# Patient Record
Sex: Male | Born: 1965 | Race: White | Hispanic: No | Marital: Married | Smoking: Former smoker
Health system: Southern US, Community
[De-identification: ages and names within clinical notes are randomized; demographics above are authoritative.]

## PROBLEM LIST (undated history)

## (undated) DIAGNOSIS — E785 Hyperlipidemia, unspecified: Secondary | ICD-10-CM

## (undated) DIAGNOSIS — I639 Cerebral infarction, unspecified: Secondary | ICD-10-CM

## (undated) DIAGNOSIS — I10 Essential (primary) hypertension: Secondary | ICD-10-CM

---

## 2001-03-13 ENCOUNTER — Ambulatory Visit (HOSPITAL_COMMUNITY): Admission: RE | Admit: 2001-03-13 | Discharge: 2001-03-13 | Payer: Self-pay | Admitting: Specialist

## 2001-03-13 ENCOUNTER — Encounter: Payer: Self-pay | Admitting: Specialist

## 2001-07-13 ENCOUNTER — Encounter: Payer: Self-pay | Admitting: Specialist

## 2001-07-13 ENCOUNTER — Encounter: Admission: RE | Admit: 2001-07-13 | Discharge: 2001-07-13 | Payer: Self-pay | Admitting: Specialist

## 2001-11-14 ENCOUNTER — Ambulatory Visit (HOSPITAL_COMMUNITY): Admission: RE | Admit: 2001-11-14 | Discharge: 2001-11-15 | Payer: Self-pay | Admitting: Neurosurgery

## 2001-11-14 ENCOUNTER — Encounter: Payer: Self-pay | Admitting: Neurosurgery

## 2003-02-23 HISTORY — PX: BACK SURGERY: SHX140

## 2006-09-05 ENCOUNTER — Ambulatory Visit (HOSPITAL_BASED_OUTPATIENT_CLINIC_OR_DEPARTMENT_OTHER): Admission: RE | Admit: 2006-09-05 | Discharge: 2006-09-05 | Payer: Self-pay | Admitting: Family Medicine

## 2006-09-11 ENCOUNTER — Ambulatory Visit: Payer: Self-pay | Admitting: Internal Medicine

## 2010-07-07 NOTE — Procedures (Signed)
NAME:  Larry Padilla, Larry Padilla            ACCOUNT NO.:  [id number]   MEDICAL RECORD NO.:  [id number]          PATIENT TYPE:  OUT   LOCATION:  SLEEP CENTER                 FACILITY:  [facility]   PHYSICIAN:  Clinton D. Maple Hudson, MD, FCCP, FACPDATE OF BIRTH:  1966/12/07   DATE OF STUDY:  09/05/2006                            NOCTURNAL POLYSOMNOGRAM   REFERRING PHYSICIAN:  Marge Duncans, NP   INDICATION FOR STUDY:  Hypersomnia with sleep apnea.   EPWORTH SLEEPINESS SCORE:  16/24, height 63 inches, weight 350 pounds.   HOME MEDICATIONS:  Listed and reviewed.   SLEEP ARCHITECTURE:  Short total sleep time 251 minutes with sleep  efficiency 70%.  Stage I was 7%; stage II 68%; stage III 17%; REM 8% of  total sleep time.  Sleep latency 11 minutes.  REM latency 177 minutes.  Awake after sleep 50 minutes.  Arousal index 10.5.  No bedtime  medication was taken.  Sleep onset was at 9:52 p.m.  The recording ended  after spontaneous waking around 3 a.m.  He said sleep quality was the  same as usual, interrupted and that his back hurt.   RESPIRATORY DATA:  Apnea/hypopnea index (AHI, RDI) 1 obstructive event  per hour which is within normal.  There were a total of 4 hypopneas  primarily associated with sleep in the left side down position.  REM AHI  zero.  There were insufficient events to permit CPAP titration by split  protocol on the study night.   OXYGEN DATA:  Very loud snoring with oxygen desaturation to a nadir of  86%.  Mean oxygen saturation through the study was 92% on room air.   CARDIAC DATA:  Normal sinus rhythm.   MOVEMENT-PARASOMNIA:  No significant movement disturbance, no bathroom  trips.   IMPRESSIONS-RECOMMENDATIONS:  1. Short total sleep time and pattern of repeated nonspecific wakings      during sleep before early waking in the morning around 3 a.m.      suggest comparison with the patient's home sleep quality and      experience.  2. Review of the recorded data does not  suggest significant      respiratory events of leg movements as a basis for sleep      fragmentation.  AHI showing 1 obstructive event per hour indicates      there is not stated a sleep apnea pattern.  This is a little      surprising given the patient's body habitus and loud snoring but      may be correct.      Suggest comparison with witnessed respiratory pattern.  It may      become appropriate to re-study this patient at some time in the      future if symptoms persist.      Clinton D. Maple Hudson, MD, [facility], FACP  Diplomate, Biomedical engineer of Sleep Medicine  Electronically Signed     CDY/MEDQ  D:  09/11/2006 13:13:49  T:  09/11/2006 15:24:47  Job:  [id number]

## 2010-07-10 NOTE — Op Note (Signed)
NAME:  Larry Padilla, HACKER                        ACCOUNT NO.:  [id number]   MEDICAL RECORD NO.:  [id number]                   PATIENT TYPE:  OIB   LOCATION:  2899                                 FACILITY:  [facility]   PHYSICIAN:  Reinaldo Meeker, M.D.              DATE OF BIRTH:  1965-12-24   DATE OF PROCEDURE:  11/14/2001  DATE OF DISCHARGE:                                 OPERATIVE REPORT   PREOPERATIVE DIAGNOSIS:  Herniated disk at L4-L5, central and right.   POSTOPERATIVE DIAGNOSIS:  Herniated disk at L4-L5, central and right.   PROCEDURES:  1. Right L4-L5 intralaminar laminotomy for excision of herniated disk with     operating microscope.  2. Microdissection, L4-L5 disk and L5 nerve root.   SURGEON:  Reinaldo Meeker, M.D.   ASSISTANT:  Donzetta Sprung. Wynetta Emery, M.D.   PROCEDURE IN DETAIL:  After being placed in the prone position, the  patient's back was prepped and draped in the usual sterile fashion.  Localization x-ray was taken prior to incision to identify the appropriate  level.  A midline incision was made above the spinous processes of L4 and  L5.  Using Bovie cutting current, the incision was carried down the spinous  processes.  Subperiosteal dissection was then carried out along the right  side of the spinous processes and lamina and a McCall self-retaining  retractor was placed for exposure.  A second x-ray showed approach of the L4-  L5 level.  Using the high-speed drill, the inferior one-half of the L4  lamina and the medial one-third of the facet removed.  The drill was then  used for the superior one-third of the L5 lamina.  Residual bone and  ligamentum flavum removed in a piecemeal fashion.  The microscope was draped  and brought into the field and used for the remainder the case.   Using microdissection technique, the lateral aspect of the thecal sac and  nerve root were identified.  Photocoagulation was carried out down the floor  of the canal to identify the L4-L5  disk which was found to be herniated  centrally.  After coagulating on the annulus, the annulus was incised with a  15-blade.  Using pituitary rongeurs ad curets, very thorough disk space  cleanout was carried out.  At the same time great care was taken to avoid  injury to the neural elements and this was successfully done.  At this point  inspection was carried out in all directions without any evidence of  residual compression and none could be identified.  Large amounts of  irrigation were carried out and any bleeding was controlled with bipolar  coagulation and Gelfoam.  The wound was then closed using intraoperative  Vicryl on the muscle, fascia, subcutaneous and subcuticular tissues and  staples on the skin.  A sterile dressing was then applied and the patient  was extubated and taken to the recovery room in  stable condition.                                                 Reinaldo Meeker, M.D.    ROK/MEDQ  D:  11/14/2001  T:  11/15/2001  Job:  [id number]

## 2011-03-26 DIAGNOSIS — I639 Cerebral infarction, unspecified: Secondary | ICD-10-CM

## 2011-03-26 HISTORY — DX: Cerebral infarction, unspecified: I63.9

## 2011-05-12 ENCOUNTER — Inpatient Hospital Stay (HOSPITAL_COMMUNITY)
Admission: EM | Admit: 2011-05-12 | Discharge: 2011-05-17 | DRG: 065 | Disposition: A | Discharge status: Home / Self Care | Payer: Self-pay | Source: Ambulatory Visit | Attending: Neurology | Admitting: Neurology

## 2011-05-12 ENCOUNTER — Encounter (HOSPITAL_COMMUNITY): Payer: Self-pay | Admitting: *Deleted

## 2011-05-12 ENCOUNTER — Emergency Department (HOSPITAL_COMMUNITY): Payer: Self-pay

## 2011-05-12 DIAGNOSIS — F419 Anxiety disorder, unspecified: Secondary | ICD-10-CM | POA: Diagnosis present

## 2011-05-12 DIAGNOSIS — H547 Unspecified visual loss: Secondary | ICD-10-CM | POA: Diagnosis present

## 2011-05-12 DIAGNOSIS — I619 Nontraumatic intracerebral hemorrhage, unspecified: Principal | ICD-10-CM | POA: Diagnosis present

## 2011-05-12 DIAGNOSIS — R51 Headache: Secondary | ICD-10-CM | POA: Diagnosis present

## 2011-05-12 DIAGNOSIS — T40605A Adverse effect of unspecified narcotics, initial encounter: Secondary | ICD-10-CM | POA: Diagnosis not present

## 2011-05-12 DIAGNOSIS — H53469 Homonymous bilateral field defects, unspecified side: Secondary | ICD-10-CM | POA: Diagnosis present

## 2011-05-12 DIAGNOSIS — E876 Hypokalemia: Secondary | ICD-10-CM | POA: Diagnosis not present

## 2011-05-12 DIAGNOSIS — IMO0001 Reserved for inherently not codable concepts without codable children: Secondary | ICD-10-CM | POA: Diagnosis present

## 2011-05-12 DIAGNOSIS — E785 Hyperlipidemia, unspecified: Secondary | ICD-10-CM | POA: Diagnosis present

## 2011-05-12 DIAGNOSIS — R110 Nausea: Secondary | ICD-10-CM | POA: Diagnosis not present

## 2011-05-12 DIAGNOSIS — I10 Essential (primary) hypertension: Secondary | ICD-10-CM | POA: Diagnosis present

## 2011-05-12 DIAGNOSIS — F411 Generalized anxiety disorder: Secondary | ICD-10-CM | POA: Diagnosis not present

## 2011-05-12 DIAGNOSIS — R519 Headache, unspecified: Secondary | ICD-10-CM | POA: Diagnosis present

## 2011-05-12 DIAGNOSIS — E669 Obesity, unspecified: Secondary | ICD-10-CM | POA: Diagnosis present

## 2011-05-12 DIAGNOSIS — I629 Nontraumatic intracranial hemorrhage, unspecified: Secondary | ICD-10-CM

## 2011-05-12 DIAGNOSIS — Z6839 Body mass index (BMI) 39.0-39.9, adult: Secondary | ICD-10-CM

## 2011-05-12 HISTORY — DX: Essential (primary) hypertension: I10

## 2011-05-12 LAB — POCT I-STAT, CHEM 8
Calcium, Ion: 1.18 mmol/L (ref 1.12–1.32)
HCT: 50 % (ref 39.0–52.0)
Hemoglobin: 17 g/dL (ref 13.0–17.0)
TCO2: 23 mmol/L (ref 0–100)

## 2011-05-12 LAB — CBC
Platelets: 308 10*3/uL (ref 150–400)
RBC: 5.13 MIL/uL (ref 4.22–5.81)
RDW: 13.3 % (ref 11.5–15.5)
WBC: 14.3 10*3/uL — ABNORMAL HIGH (ref 4.0–10.5)

## 2011-05-12 LAB — SEDIMENTATION RATE: Sed Rate: 12 mm/hr (ref 0–16)

## 2011-05-12 LAB — PROTIME-INR
INR: 0.97 (ref 0.00–1.49)
Prothrombin Time: 13.1 seconds (ref 11.6–15.2)

## 2011-05-12 MED ORDER — NICARDIPINE HCL IN NACL 20-0.86 MG/200ML-% IV SOLN
3.0000 mg/h | INTRAVENOUS | Status: DC
  Administered 2011-05-12: 7.5 mg/h via INTRAVENOUS
  Administered 2011-05-12 – 2011-05-13 (×2): 5 mg/h via INTRAVENOUS
  Administered 2011-05-13: 7.5 mg/h via INTRAVENOUS
  Administered 2011-05-13 (×2): 5 mg/h via INTRAVENOUS
  Filled 2011-05-12 (×11): qty 200

## 2011-05-12 MED ORDER — PANTOPRAZOLE SODIUM 40 MG IV SOLR
40.0000 mg | Freq: Every day | INTRAVENOUS | Status: DC
  Filled 2011-05-12: qty 40

## 2011-05-12 MED ORDER — MORPHINE SULFATE 2 MG/ML IJ SOLN
1.0000 mg | INTRAMUSCULAR | Status: DC | PRN
  Administered 2011-05-12 (×3): 2 mg via INTRAVENOUS
  Administered 2011-05-12 – 2011-05-13 (×3): 4 mg via INTRAVENOUS
  Administered 2011-05-13: 2 mg via INTRAVENOUS
  Administered 2011-05-13: 4 mg via INTRAVENOUS
  Administered 2011-05-13 (×2): 2 mg via INTRAVENOUS
  Administered 2011-05-13: 4 mg via INTRAVENOUS
  Filled 2011-05-12: qty 2
  Filled 2011-05-12 (×2): qty 1
  Filled 2011-05-12 (×3): qty 2
  Filled 2011-05-12: qty 1
  Filled 2011-05-12: qty 2
  Filled 2011-05-12: qty 1
  Filled 2011-05-12: qty 2
  Filled 2011-05-12: qty 1

## 2011-05-12 MED ORDER — SENNOSIDES-DOCUSATE SODIUM 8.6-50 MG PO TABS
1.0000 | ORAL_TABLET | Freq: Two times a day (BID) | ORAL | Status: DC
  Administered 2011-05-12 – 2011-05-17 (×8): 1 via ORAL
  Filled 2011-05-12 (×8): qty 1

## 2011-05-12 MED ORDER — PANTOPRAZOLE SODIUM 40 MG PO TBEC
40.0000 mg | DELAYED_RELEASE_TABLET | Freq: Every day | ORAL | Status: DC
  Administered 2011-05-13 – 2011-05-16 (×4): 40 mg via ORAL
  Filled 2011-05-12 (×5): qty 1

## 2011-05-12 MED ORDER — METOCLOPRAMIDE HCL 5 MG/ML IJ SOLN
10.0000 mg | Freq: Once | INTRAMUSCULAR | Status: AC
  Administered 2011-05-12: 10 mg via INTRAVENOUS
  Filled 2011-05-12: qty 2

## 2011-05-12 MED ORDER — DIPHENHYDRAMINE HCL 50 MG/ML IJ SOLN
25.0000 mg | Freq: Once | INTRAMUSCULAR | Status: AC
  Administered 2011-05-12: 25 mg via INTRAVENOUS
  Filled 2011-05-12: qty 1

## 2011-05-12 MED ORDER — LABETALOL HCL 5 MG/ML IV SOLN
20.0000 mg | Freq: Once | INTRAVENOUS | Status: AC
  Administered 2011-05-12: 20 mg via INTRAVENOUS
  Filled 2011-05-12: qty 4

## 2011-05-12 MED ORDER — TRAMADOL HCL 50 MG PO TABS
50.0000 mg | ORAL_TABLET | Freq: Four times a day (QID) | ORAL | Status: DC | PRN
  Administered 2011-05-12: 100 mg via ORAL
  Filled 2011-05-12: qty 2

## 2011-05-12 MED ORDER — ACETAMINOPHEN 650 MG RE SUPP: 650.0000 mg | RECTAL | Status: DC | PRN

## 2011-05-12 MED ORDER — ACETAMINOPHEN 325 MG PO TABS
650.0000 mg | ORAL_TABLET | ORAL | Status: DC | PRN
  Administered 2011-05-17: 650 mg via ORAL
  Filled 2011-05-12: qty 2

## 2011-05-12 MED ORDER — NICARDIPINE HCL IN NACL 20-0.86 MG/200ML-% IV SOLN: 3.0000 mg/h | INTRAVENOUS | Status: DC

## 2011-05-12 MED ORDER — SODIUM CHLORIDE 0.9 % IV SOLN
Freq: Once | INTRAVENOUS | Status: AC
  Administered 2011-05-12: 15:00:00 via INTRAVENOUS

## 2011-05-12 MED ORDER — DEXAMETHASONE SODIUM PHOSPHATE 10 MG/ML IJ SOLN
10.0000 mg | Freq: Once | INTRAMUSCULAR | Status: AC
  Administered 2011-05-12: 10 mg via INTRAVENOUS
  Filled 2011-05-12: qty 1

## 2011-05-12 MED ORDER — ONDANSETRON HCL 4 MG/2ML IJ SOLN
4.0000 mg | Freq: Four times a day (QID) | INTRAMUSCULAR | Status: DC | PRN
  Administered 2011-05-13 – 2011-05-16 (×3): 4 mg via INTRAVENOUS
  Filled 2011-05-12 (×3): qty 2

## 2011-05-12 NOTE — H&P (Signed)
Admission H&P    Chief Complaint: Headache, difficulty with vision HPI: Larry Padilla is an 46 y.o. male who reports that for the past 2 months he has been having trouble with blurry vision and markedly elevated blood pressure.  Went to bed normal last evening but awakened this morning unable to see to the right and with a severe headache behind the right eye.  He describes the headache as sharp.  There is no associated nausea and vomiting but there is photophobia and phonophobia.  Patient attempted to take his BP meds and pain meds at home but after no relief presented for further evaluation.  Head CT shows a left occipital intracerebral hemorrhage.    LSN: 05/11/2011 tPA Given: No: ICH MRankin: 0  Past Medical History  Diagnosis Date  . Hypertension     History reviewed. No pertinent past surgical history.  History reviewed. No pertinent family history.  Social History:  reports that he has never smoked. He has never used smokeless tobacco. He reports that he does not drink alcohol or use illicit drugs.  Allergies: No Known Allergies  Medications Prior to Admission  Medication Dose Route Frequency Provider Last Rate Last Dose  . 0.9 %  sodium chloride infusion   Intravenous Once Carleene Cooper III, MD 160 mL/hr at 05/12/11 1526    . dexamethasone (DECADRON) injection 10 mg  10 mg Intravenous Once Carleene Cooper III, MD   10 mg at 05/12/11 1528  . diphenhydrAMINE (BENADRYL) injection 25 mg  25 mg Intravenous Once Carleene Cooper III, MD   25 mg at 05/12/11 1527  . labetalol (NORMODYNE,TRANDATE) injection 20 mg  20 mg Intravenous Once Carleene Cooper III, MD   20 mg at 05/12/11 1531  . labetalol (NORMODYNE,TRANDATE) injection 20 mg  20 mg Intravenous Once Carleene Cooper III, MD   20 mg at 05/12/11 1643  . metoCLOPramide (REGLAN) injection 10 mg  10 mg Intravenous Once Carleene Cooper III, MD   10 mg at 05/12/11 1531   No current outpatient prescriptions on file as of 05/12/2011.  Home  Medications: Lisinopril, Tylenol  ROS: History obtained from the patient  General ROS: negative for - chills, fatigue, fever, night sweats, weight gain or weight loss Psychological ROS: negative for - behavioral disorder, hallucinations, memory difficulties, mood swings or suicidal ideation Ophthalmic ROS: as noted in HPI ENT ROS: negative for - epistaxis, nasal discharge, oral lesions, sore throat, tinnitus or vertigo Allergy and Immunology ROS: negative for - hives or itchy/watery eyes Hematological and Lymphatic ROS: negative for - bleeding problems, bruising or swollen lymph nodes Endocrine ROS: negative for - galactorrhea, hair pattern changes, polydipsia/polyuria or temperature intolerance Respiratory ROS: negative for - cough, hemoptysis, shortness of breath or wheezing Cardiovascular ROS: negative for - chest pain, dyspnea on exertion, edema or irregular heartbeat Gastrointestinal ROS: negative for - abdominal pain, diarrhea, hematemesis, nausea/vomiting or stool incontinence Genito-Urinary ROS: negative for - dysuria, hematuria, incontinence or urinary frequency/urgency Musculoskeletal ROS: negative for - joint swelling or muscular weakness Neurological ROS: as noted in HPI Dermatological ROS: negative for rash and skin lesion changes  Physical Examination: Blood pressure 191/104, pulse 73, temperature 97.5 F (36.4 C), resp. rate 18, SpO2 98.00%.  HEENT-  Normocephalic, no lesions, without obvious abnormality.  Normal external eye and conjunctiva.  Normal TM's bilaterally.  Normal auditory canals and external ears. Normal external nose, mucus membranes and septum.  Normal pharynx. Neck supple with no masses, nodes, nodules or enlargement. Cardiovascular - S1, S2 normal Lungs -  chest clear, no wheezing, rales, normal symmetric air entry Abdomen - soft, non-tender; bowel sounds normal; no masses,  no organomegaly Extremities - no edema  Neurologic Examination: Mental  Status: Alert, oriented, thought content appropriate.  Speech fluent without evidence of aphasia.  Able to follow 3 step commands without difficulty. Cranial Nerves: II: RHH, pupils equal, round, reactive to light and accommodation III,IV, VI: ptosis not present, extra-ocular motions intact bilaterally V,VII: smile symmetric, facial light touch sensation normal bilaterally VIII: hearing normal bilaterally IX,X: gag reflex present XI: trapezius strength/neck flexion strength normal bilaterally XII: tongue strength normal  Motor: Right : Upper extremity   5/5    Left:     Upper extremity   5-/5 (give-way weakness with no evidence of a drift)  Lower extremity   5/5     Lower extremity   5/5 Tone and bulk:normal tone throughout; no atrophy noted Sensory: Pinprick and light touch intact throughout, bilaterally Deep Tendon Reflexes: 1+ and symmetric with absent AJ's bilaterally Plantars: Right: upgoing   Left: downgoing Cerebellar: normal finger-to-nose and normal heel-to-shin test Presenting with headache and   Results for orders placed during the hospital encounter of 05/12/11 (from the past 48 hour(s))  CBC     Status: Abnormal   Collection Time   05/12/11  3:20 PM      Component Value Range Comment   WBC 14.3 (*) 4.0 - 10.5 (K/uL)    RBC 5.13  4.22 - 5.81 (MIL/uL)    Hemoglobin 15.8  13.0 - 17.0 (g/dL)    HCT 16.1  09.6 - 04.5 (%)    MCV 86.5  78.0 - 100.0 (fL)    MCH 30.8  26.0 - 34.0 (pg)    MCHC 35.6  30.0 - 36.0 (g/dL)    RDW 40.9  81.1 - 91.4 (%)    Platelets 308  150 - 400 (K/uL)   PROTIME-INR     Status: Normal   Collection Time   05/12/11  3:20 PM      Component Value Range Comment   Prothrombin Time 13.1  11.6 - 15.2 (seconds)    INR 0.97  0.00 - 1.49    POCT I-STAT, CHEM 8     Status: Abnormal   Collection Time   05/12/11  4:24 PM      Component Value Range Comment   Sodium 139  135 - 145 (mEq/L)    Potassium 3.7  3.5 - 5.1 (mEq/L)    Chloride 106  96 - 112  (mEq/L)    BUN 15  6 - 23 (mg/dL)    Creatinine, Ser 7.82  0.50 - 1.35 (mg/dL)    Glucose, Bld 956 (*) 70 - 99 (mg/dL)    Calcium, Ion 2.13  1.12 - 1.32 (mmol/L)    TCO2 23  0 - 100 (mmol/L)    Hemoglobin 17.0  13.0 - 17.0 (g/dL)    HCT 08.6  57.8 - 46.9 (%)    Ct Head Wo Contrast  05/12/2011  *RADIOLOGY REPORT*  Clinical Data: Severe frontal headache.  Hypertension.  CT HEAD WITHOUT CONTRAST  Technique:  Contiguous axial images were obtained from the base of the skull through the vertex without contrast.  Comparison: None.  Findings: There is an acute intraparenchymal hemorrhage in the left occipital lobe measuring 4.1 x 2.2 by 2 cm.  There is mild surrounding edema.  No shift.  Elsewhere, I think there are a few areas of low density in the hemispheric white matter suggesting chronic  small vessel disease.  No subarachnoid blood.  No intraventricular blood.  No sign of mass, hydrocephalus or extra- axial collection.  The calvarium is unremarkable.  Sinuses are clear.  IMPRESSION: Acute intraparenchymal hematoma left occipital lobe with volume measuring 8 ml.  Very mild surrounding edema.  Critical Value/emergent results were called by telephone at the time of interpretation on 05/12/2011  at 1555 hours  to  Dr. Patrica Duel, who verbally acknowledged these results.  Original Report Authenticated By: Thomasenia Sales, M.D.    Assessment: 46 y.o. male presenting with headache and RHH.  Left occipital hemrorrhage noted on imaging.  ICH likely related to hypertension.  BP markedly elevated and not responding sufficiently to Labetolol.  Pain control may help BP a well.    Stroke Risk Factors - hypertension  Plan: 1. HgbA1c, fasting lipid panel 2. MRI, MRA  of the brain without contrast 3. PT consult 4. Echocardiogram 5. Carotid dopplers 6. Prophylactic therapy-to be held at this time. 7. Cardene drip for BP control 8. Telemetry monitoring 9.  Admission to 3100 for monitoring.  Neurosurgery has evaluated  the patient and has determined that he is not an operative candidate.   10. Pain control    Thana Farr, MD [facility] [phone] 05/12/2011, 4:56 PM

## 2011-05-12 NOTE — ED Notes (Signed)
Pt resting, responsive to voice. Moves all extremities. Pupils sluggish 2+, weaker on left side. EDP aware of pt BP

## 2011-05-12 NOTE — ED Notes (Signed)
Pt reporting decrease in peripheral vision. BP 190/98. Pt appearing to be having anxiety attack. Dr. Ignacia Palma notified. Pt provided bag to take deep breaths.

## 2011-05-12 NOTE — ED Notes (Signed)
Per EMS- pt has had headache that started this morning at approx 7am. Pt states it is in the front of his head. Pt has hx of high BP. Take lisinopril at night. Pt took tylenol this morning with no relief. Bp 183/139. 70 bpm. Pt was on 4L.

## 2011-05-12 NOTE — ED Notes (Signed)
Pt reporting woke up this morning with headache, headache has worsened throughout the day. Pt reporting decreased peripheral vision. Pt reporting nausea, denying any vomiting. Vision diminished in periphery. Pupils sluggish. Pt responsive.

## 2011-05-12 NOTE — ED Notes (Signed)
3103-01 Ready 

## 2011-05-12 NOTE — ED Provider Notes (Cosign Needed)
History     CSN: [id number]  Arrival date & time 05/12/11  1429   First MD Initiated Contact with Patient 05/12/11 1506      Chief Complaint  Patient presents with  . Headache    (Consider location/radiation/quality/duration/timing/severity/associated sxs/prior treatment) HPI Comments: Patient is a 46 year old man who comes in complaining of severe headache. This started this morning. He says it hurts so bad he can't see. He says he can't see his right eye and has total focusing the left eye. He took aspirin and some other type of medicine for headache without relief he also took his lisinopril pill and had been prescribed in ED in the past. None of this seemed to help. The pain is in the left side of his head. He has associated nausea. He had no prior similar headache history.  Patient is a 46 y.o. male presenting with headaches.  Headache  This is a new problem. The current episode started 6 to 12 hours ago. The problem occurs constantly. The problem has not changed since onset.The headache is associated with nothing. The pain is located in the left unilateral region. The quality of the pain is described as throbbing. The pain is at a severity of 10/10. The pain is severe. The pain does not radiate. Associated symptoms include anorexia and nausea. Pertinent negatives include no chest pressure, no syncope, no shortness of breath and no vomiting. Treatments tried: Patient took an aspirin and some other type of over-the-counter pain medicine without relief. He also took a lisinopril without relief.    Past Medical History  Diagnosis Date  . Hypertension     No past surgical history on file.  No family history on file.  History  Substance Use Topics  . Smoking status: Not on file  . Smokeless tobacco: Not on file  . Alcohol Use:       Review of Systems  Constitutional: Negative.   Eyes: Positive for visual disturbance.  Respiratory: Negative.  Negative for shortness of  breath.   Cardiovascular: Negative.  Negative for chest pain and syncope.  Gastrointestinal: Positive for nausea and anorexia. Negative for vomiting and abdominal pain.  Genitourinary: Negative.   Musculoskeletal: Negative.   Neurological: Positive for headaches.  Psychiatric/Behavioral: Negative.     Allergies  Review of patient's allergies indicates not on file.  Home Medications   Current Outpatient Rx  Name Route Sig Dispense Refill  . LISINOPRIL 20 MG PO TABS Oral Take 20 mg by mouth daily.      BP 190/98  Pulse 77  Temp 97.5 F (36.4 C)  Resp 22  SpO2 100%  Physical Exam  Nursing note and vitals reviewed. Constitutional: He is oriented to person, place, and time.       Obese man in acute distress complaining of headache felt in the left temporal region. He is hypertensive on vital signs.  HENT:  Head: Normocephalic and atraumatic.  Right Ear: External ear normal.  Left Ear: External ear normal.  Eyes: Conjunctivae and EOM are normal. Pupils are equal, round, and reactive to light.       Patient cannot count fingers accurately with either eye.  Neck: Normal range of motion. Neck supple.  Cardiovascular: Normal rate, regular rhythm and normal heart sounds.   Pulmonary/Chest: Effort normal and breath sounds normal.  Abdominal: Soft. Bowel sounds are normal.  Musculoskeletal: Normal range of motion. He exhibits no edema and no tenderness.  Neurological: He is alert and oriented to person, place, and  time.       Patient claims a loss of visual acuity. There is no other sensory deficit. There is no apparent motor deficit.  Skin: Skin is warm and dry.  Psychiatric: He has a normal mood and affect. His behavior is normal.    ED Course  CRITICAL CARE Performed by: Osvaldo Human Authorized by: Osvaldo Human Total critical care time: 30 minutes Critical care was necessary to treat or prevent imminent or life-threatening deterioration of the following  conditions: Pt with severe headache and cortical blindness, who had hypertensive bleed in left occipital region. Critical care was time spent personally by me on the following activities: development of treatment plan with patient or surrogate, discussions with consultants, evaluation of patient's response to treatment, examination of patient, obtaining history from patient or surrogate, ordering and performing treatments and interventions, ordering and review of laboratory studies, ordering and review of radiographic studies, re-evaluation of patient's condition and review of old charts.   (including critical care time) 3:22 PM Pt was seen and had physical examination.  IV fluids, oxygen ordered.  IV medications for headache with Reglan, dexamethasone, and benadryl, and for hypertension with labetalol.  Lab workup and head CT ordered.   3:57 PM CT of head shows left occipital bleed.  Call to Neurosurgery --> 4:16 PM Dr. Wynetta Emery says that this hemorrhage is in a non-operative area of the brain, advised call to Neurology.  4:30 PM Dr. Thad Ranger called back.  She will see pt.  Pt's BP remains high.  Repeated IV labetalol.   5:37 PM Pt seen by Dr. Thad Ranger, who is admitting pt to 3100, placing him on a Cardene infusion for BP control  1. Intracranial hemorrhage           Carleene Cooper III, MD 05/12/11 [phone]

## 2011-05-12 NOTE — ED Notes (Signed)
Swallow screen not done in ER due to pt being NPO and needing to lay flat-30 degrees.

## 2011-05-13 ENCOUNTER — Inpatient Hospital Stay (HOSPITAL_COMMUNITY): Payer: Self-pay

## 2011-05-13 DIAGNOSIS — I369 Nonrheumatic tricuspid valve disorder, unspecified: Secondary | ICD-10-CM

## 2011-05-13 LAB — LIPID PANEL
Cholesterol: 230 mg/dL — ABNORMAL HIGH (ref 0–200)
HDL: 51 mg/dL (ref >39)
LDL Cholesterol: 145 mg/dL — ABNORMAL HIGH (ref 0–99)
Triglycerides: 172 mg/dL — ABNORMAL HIGH (ref <150)

## 2011-05-13 MED ORDER — LORAZEPAM 2 MG/ML IJ SOLN
1.0000 mg | Freq: Once | INTRAMUSCULAR | Status: AC
  Administered 2011-05-13: 1 mg via INTRAVENOUS
  Filled 2011-05-13: qty 1

## 2011-05-13 MED ORDER — TRAMADOL HCL 50 MG PO TABS
100.0000 mg | ORAL_TABLET | Freq: Four times a day (QID) | ORAL | Status: DC | PRN
  Administered 2011-05-13 – 2011-05-17 (×13): 100 mg via ORAL
  Filled 2011-05-13 (×14): qty 2

## 2011-05-13 MED ORDER — DIVALPROEX SODIUM ER 500 MG PO TB24
500.0000 mg | ORAL_TABLET | Freq: Every day | ORAL | Status: DC
  Administered 2011-05-14: 500 mg via ORAL
  Filled 2011-05-13: qty 1

## 2011-05-13 MED ORDER — VALPROATE SODIUM 500 MG/5ML IV SOLN
2.0000 g | Freq: Once | INTRAVENOUS | Status: AC
  Administered 2011-05-13: 2000 mg via INTRAVENOUS
  Filled 2011-05-13 (×2): qty 20

## 2011-05-13 MED ORDER — LISINOPRIL 20 MG PO TABS
20.0000 mg | ORAL_TABLET | Freq: Every day | ORAL | Status: DC
  Administered 2011-05-13 – 2011-05-15 (×3): 20 mg via ORAL
  Filled 2011-05-13 (×3): qty 1

## 2011-05-13 MED FILL — Perflutren Lipid Microsphere IV Susp 6.52 MG/ML: INTRAVENOUS | Qty: 2 | Status: AC

## 2011-05-13 NOTE — Progress Notes (Signed)
PT/OT Cancellation Note   Treatment cancelled today due to: pt on bed rest.  PT/OT evaluations pending increased activity orders.  Will re-attempt as appropriate.  05/13/2011 Cipriano Mile OTR/L Pager [phone] Office [phone]

## 2011-05-13 NOTE — Progress Notes (Signed)
Occupational Therapy Evaluation Patient Details Name: Larry Padilla MRN: [id number] DOB: 1965-12-18 Today's Date: 05/13/2011  Problem List:  Patient Active Problem List  Diagnoses  . Intracerebral hemorrhage  . Headache  . Hypertension, malignant    Past Medical History:  Past Medical History  Diagnosis Date  . Hypertension    Past Surgical History: History reviewed. No pertinent past surgical history.  OT Assessment/Plan/Recommendation OT Assessment Clinical Impression Statement: Pt presents with a medical diagnosis of occipital ICH. Pt able to complete basic functional mobility with fatigue and some difficulty due to vision.  Will benefit from acute OT services to address visual deficits for at least one more session in prep for d/c home with OPOT for visual deficit. OT Recommendation/Assessment: Patient will need skilled OT in the acute care venue OT Problem List: Impaired vision/perception OT Therapy Diagnosis : Disturbance of vision OT Plan OT Frequency: Min 2X/week OT Treatment/Interventions: Self-care/ADL training;Patient/family education;Visual/perceptual remediation/compensation OT Recommendation Follow Up Recommendations: Outpatient OT (for visual deficit) Equipment Recommended: None recommended by OT Individuals Consulted Consulted and Agree with Results and Recommendations: Patient OT Goals Acute Rehab OT Goals OT Goal Formulation: With patient Time For Goal Achievement: 7 days Miscellaneous OT Goals Miscellaneous OT Goal #1: Pt will use compensatory visual strategies to locate ADL objects in Rt. visual field in prep for ADL activity. OT Goal: Miscellaneous Goal #1 - Progress: Goal set today Miscellaneous OT Goal #2: Pt will consistently use compensatory strategies to attend to Rt side and identify objects in environment. OT Goal: Miscellaneous Goal #2 - Progress: Goal set today  OT Evaluation Precautions/Restrictions    Prior Functioning Home  Living Lives With: Significant other;Other (Comment) (fiancee is pregnant, due in July) Receives Help From: Other (Comment) (fiance) Type of Home: House Home Layout: One level Home Access: Level entry Bathroom Shower/Tub: Tub/shower unit;Curtain Bathroom Toilet: Standard Bathroom Accessibility: Yes How Accessible: Accessible via walker Home Adaptive Equipment: None Prior Function Level of Independence: Independent with basic ADLs;Independent with homemaking with ambulation;Independent with gait;Independent with transfers Able to Take Stairs?: Yes Driving: Yes Vocation: Unemployed (recently lost job) ADL ADL Toilet Transfer: Simulated;Other (comment) (min guard assist) Toilet Transfer Method: Stand pivot Toilet Transfer Equipment: Other (comment) (chair) ADL Comments: Anticipate that pt is able to complete BADLs with increased time due to visual deficits. Vision/Perception  Vision - History Patient Visual Report: Other (comment) (Rt visual field deficit) Vision - Assessment Eye Alignment: Within Functional Limits Vision Assessment: Vision tested Visual Fields: Right homonymous hemianopsia Additional Comments: Pt able to complete letter cancellation task accurately but reports difficulty scanning right to left.  Pt reports eyes are too fatigued and he feels "too loopy" to continue vision assessment. Cognition Cognition Arousal/Alertness: Awake/alert Overall Cognitive Status: Appears within functional limits for tasks assessed Orientation Level: Oriented X4 Sensation/Coordination Sensation Light Touch: Appears Intact Coordination Gross Motor Movements are Fluid and Coordinated: Yes Fine Motor Movements are Fluid and Coordinated: Yes Extremity Assessment RUE Assessment RUE Assessment: Within Functional Limits LUE Assessment LUE Assessment: Within Functional Limits Mobility  Bed Mobility Bed Mobility: Yes Supine to Sit: 6: Modified independent (Device/Increase  time) Sitting - Scoot to Edge of Bed: 6: Modified independent (Device/Increase time) Transfers Sit to Stand: 6: Modified independent (Device/Increase time) Stand to Sit: 6: Modified independent (Device/Increase time) Exercises   End of Session OT - End of Session Activity Tolerance: Other (comment);Patient limited by fatigue ("feeling loopy") Patient left: in chair;with call bell in reach;Other (comment) (with RN) General Behavior During Session: [facility] for tasks performed (  required max encouragement to participate) Cognition: WFL for tasks performed   5:14 PM  05/13/2011 Cipriano Mile OTR/L Pager [phone] Office [phone]

## 2011-05-13 NOTE — Progress Notes (Signed)
*  PRELIMINARY RESULTS* Echocardiogram 2D Echocardiogram has been performed.  Glean Salen [facility] 05/13/2011, 9:46 AM

## 2011-05-13 NOTE — Progress Notes (Signed)
Speech Language/Pathology Speech Language Pathology Evaluation Patient Details Name: Gabriele Loveland MRN: [id number] DOB: [birth year] Today's Date: 05/13/2011  Problem List:  Patient Active Problem List  Diagnoses  . Intracerebral hemorrhage  . Headache  . Hypertension, malignant   Past Medical History:  Past Medical History  Diagnosis Date  . Hypertension     SLP Assessment/Plan/Recommendation Assessment Clinical Impression Statement: Pt does not present with any overt acute speech, language or cognitive deficits. SLP did not assess pt with more complex functional tasks due to pt severe headache. Suspect that pt WFL though provided education regarding possible difficulty with functional tasks post d/c. Pt understands. SLP will sign off, no f/u needed at this tiem. Please reorder if concerns arise.  SLP Recommendation/Assessment: Patient does not need any further Speech Lanaguage Pathology Services Individuals Consulted Consulted and Agree with Results and Recommendations: Patient  Harlon Ditty, [place] CCC-SLP [phone] Claudine Mouton 05/13/2011, 10:16 AM

## 2011-05-13 NOTE — Progress Notes (Signed)
05/13/2011 Milana Kidney DPT PAGER: [phone] OFFICE: [phone]

## 2011-05-13 NOTE — Progress Notes (Signed)
VASCULAR LAB PRELIMINARY  PRELIMINARY  PRELIMINARY  PRELIMINARY  Carotid Dopplers completed.    Preliminary report:  No ICA stenosis.  Vertebral artery flow is antegrade.  Sherren Kerns [place], 05/13/2011, 10:17 AM

## 2011-05-13 NOTE — Progress Notes (Signed)
RN entered pt. Room pt states that "the weirdest thing just happened. I had this intense amount of pressure in my head then it went away." Pt is alert and oriented to person, place, time and situation. Pt. Can move all extremities. Neuro exam remains unchanged. Larry Padilla made aware. We will continue to monitor.

## 2011-05-13 NOTE — Progress Notes (Signed)
Physical Therapy Evaluation Patient Details Name: Larry Padilla MRN: [id number] DOB: 1965-12-20 Today's Date: 05/13/2011  Problem List:  Patient Active Problem List  Diagnoses  . Intracerebral hemorrhage  . Headache  . Hypertension, malignant    Past Medical History:  Past Medical History  Diagnosis Date  . Hypertension    Past Surgical History: History reviewed. No pertinent past surgical history.  PT Assessment/Plan/Recommendation PT Assessment Clinical Impression Statement: Pt presents with a medical diagnosis of occipital ICH. Pt is at his baseline functional levels although he is unable to complete all mobility secondary to feelings of malaise. Will sign off on pt. If MD feels necessary, please reorder. PT Recommendation/Assessment: Patent does not need any further PT services No Skilled PT: All education completed;Patient at baseline level of functioning PT Recommendation Follow Up Recommendations: No PT follow up Equipment Recommended: None recommended by PT PT Goals     PT Evaluation Precautions/Restrictions    Prior Functioning  Home Living Lives With: Significant other Receives Help From: Other (Comment) (fiance) Type of Home: House Home Layout: One level Home Access: Level entry Bathroom Shower/Tub: Tub/shower unit;Curtain Firefighter: Standard Bathroom Accessibility: Yes How Accessible: Accessible via walker Home Adaptive Equipment: None Prior Function Level of Independence: Independent with basic ADLs;Independent with homemaking with ambulation;Independent with gait;Independent with transfers Able to Take Stairs?: Yes Driving: Yes Vocation: Unemployed Cognition Cognition Arousal/Alertness: Awake/alert Overall Cognitive Status: Appears within functional limits for tasks assessed Orientation Level: Oriented X4 Sensation/Coordination Sensation Light Touch: Appears Intact Coordination Gross Motor Movements are Fluid and Coordinated:  Yes Extremity Assessment RLE Assessment RLE Assessment: Within Functional Limits LLE Assessment LLE Assessment: Within Functional Limits Mobility (including Balance) Bed Mobility Bed Mobility: Yes Supine to Sit: 6: Modified independent (Device/Increase time) Sitting - Scoot to Edge of Bed: 6: Modified independent (Device/Increase time) Transfers Transfers: Yes Sit to Stand: 6: Modified independent (Device/Increase time) Stand to Sit: 6: Modified independent (Device/Increase time) Stand Pivot Transfers: 5: Supervision;With armrests Stand Pivot Transfer Details (indicate cue type and reason): Supervision for safety secondary to pt with blurry vision Ambulation/Gait Ambulation/Gait: No (pt declined secondary to malaise) Stairs: No    Exercise    End of Session PT - End of Session Equipment Utilized During Treatment: Gait belt Activity Tolerance: Treatment limited secondary to medical complications (Comment) (headache, nausea, blurry vision) Patient left: in chair;with call bell in reach;with family/visitor present Nurse Communication: Mobility status for transfers General Behavior During Session: [facility] for tasks performed Cognition: [facility] for tasks performed  Milana Kidney 05/13/2011, 1:25 PM  05/13/2011 Milana Kidney DPT PAGER: [phone] OFFICE: [phone]

## 2011-05-13 NOTE — Progress Notes (Signed)
Stroke Team Progress Note  HISTORY Larry Padilla is an 46 y.o. male who reports that for the past 2 months he has been having trouble with blurry vision and markedly elevated blood pressure. Went to bed normal last evening 05/11/2011 but awakened this morning 05/12/2011 unable to see to the right and with a severe headache behind the right eye. He describes the headache as sharp. There is no associated nausea and vomiting but there is photophobia and phonophobia. Patient attempted to take his BP meds and pain meds at home but after no relief presented for further evaluation. Head CT shows a left occipital intracerebral hemorrhage. Patient was not a TPA candidate secondary to hemorrhage. he was admitted to the neuro ICU for further evaluation and treatment. BP 191/104 Has h/o HT x 1 year not on meds till 1 month ago when found to have BP 220/110 and started lisinopril.  SUBJECTIVE No family is at the bedside. Overall he feels his condition is stable. Complains of headache. Concerned with new loss of vision. Has not been taking his BP meds. Upset vision is impaired given prior lasik surgery.  OBJECTIVE Filed Vitals:   05/13/11 0400 05/13/11 0500 05/13/11 0600 05/13/11 0715  BP: 134/70 153/81 137/69   Pulse: 76 77 82   Temp:    98.1 F (36.7 C)  TempSrc:    Oral  Resp: 23 24 22    SpO2: 99% 99% 99%    CBG (last 3) No results found for this basename: GLUCAP:3 in the last 72 hours Intake/Output from previous day: 03/20 0701 - 03/21 0700 In: 665.8 [I.V.:665.8] Out: 550 [Urine:550]  IV Fluid Intake:     . niCARDipine 5 mg/hr (05/13/11 1610)  . DISCONTD: niCARDipine     Medications    . sodium chloride   Intravenous Once  . dexamethasone  10 mg Intravenous Once  . diphenhydrAMINE  25 mg Intravenous Once  . labetalol  20 mg Intravenous Once  . labetalol  20 mg Intravenous Once  . metoCLOPramide (REGLAN) injection  10 mg Intravenous Once  . pantoprazole  40 mg Oral QHS  . senna-docusate   1 tablet Oral BID  . DISCONTD: pantoprazole (PROTONIX) IV  40 mg Intravenous QHS  PRN acetaminophen, acetaminophen, morphine injection, ondansetron (ZOFRAN) IV, traMADol  Diet:  Cardiac thin liquids Activity:  Bedrest  DVT Prophylaxis:  SCDs   Significant Diagnostic Studies: CBC    Component Value Date/Time   WBC 14.3* 05/12/2011 1520   RBC 5.13 05/12/2011 1520   HGB 17.0 05/12/2011 1624   HCT 50.0 05/12/2011 1624   PLT 308 05/12/2011 1520   MCV 86.5 05/12/2011 1520   MCH 30.8 05/12/2011 1520   MCHC 35.6 05/12/2011 1520   RDW 13.3 05/12/2011 1520   CMP    Component Value Date/Time   NA 139 05/12/2011 1624   K 3.7 05/12/2011 1624   CL 106 05/12/2011 1624   GLUCOSE 103* 05/12/2011 1624   BUN 15 05/12/2011 1624   CREATININE 0.90 05/12/2011 1624   COAGS Lab Results  Component Value Date   INR 0.97 05/12/2011   Lipid Panel    Component Value Date/Time   CHOL 230* 05/13/2011 0415   TRIG 172* 05/13/2011 0415   HDL 51 05/13/2011 0415   CHOLHDL 4.5 05/13/2011 0415   VLDL 34 05/13/2011 0415   LDLCALC 145* 05/13/2011 0415   HgbA1C  No results found for this basename: HGBA1C   Urine Drug Screen  No results found for this basename: labopia, cocainscrnur,  labbenz, amphetmu, thcu, labbarb    Alcohol Level No results found for this basename: eth   MRSA neg   CT of the brain 05/12/2011  Acute intraparenchymal hematoma left occipital lobe with volume measuring 8 ml.  Very mild surrounding edema.   MRI of the brain  Will order  MRA of the brain  Will ordered   2D Echocardiogram  ordered   Carotid Doppler  ordered   CXR  Not ordered   EKG  Not ordered .   Physical Exam   Obese young caucasian male not in distress.afebrile. Distal pulses well felt. Neck is supple. No bruit. Hearing is normal. Cardiac exam no murmur  Or gallop. Lungs clear to auscultation. Abdomen soft nontender. Neurological exam Awake  Alert oriented x 3. Normal speech and language.eye movements full without  nystagmus.Dense right homonymous hemianopia. Face symmetric. Tongue midline. Normal strength, tone, reflexes and coordination. Normal sensation. Gait deferred.   ASSESSMENT Mr. Larry Padilla is a 46 y.o. male with a left occipital hemorrhage, secondary to malignant hypertension. Patient with resultant loss of vision right and blurred vision left eye.  -malignant hypertension leading to left occipital hemorrhagic stroke -dyslipidemia, consider statin in the future  Hospital day # 1  TREATMENT/PLAN -resume lisinopril -add HCTZ -depacon 2 gm load followed by depakote ER 500 daily -ultram for headache -check MRI/MRA. Ativan on call. Strict control oh HT with BP goal below 180. This patient is critically ill and at significant risk of neurological worsening, death and care requires constant monitoring of vital signs, hemodynamics,respiratory and cardiac monitoring, neurological assessment, discussion with family, other specialists and medical decision making of high complexity. I spent 30 minutes of neurocritical care time  in the care of  this patient.   Joaquin Music, ANP-BC, GNP-BC [name] Pager: [phone] 05/13/2011 8:00 AM  Dr. Delia Heady, Stroke Center Medical Director, has personally reviewed chart, pertinent data, examined the patient and developed the plan of care.

## 2011-05-14 MED ORDER — METHYLPREDNISOLONE 4 MG PO KIT: 8.0000 mg | PACK | Freq: Every evening | ORAL | Status: DC

## 2011-05-14 MED ORDER — METHYLPREDNISOLONE 4 MG PO KIT: 4.0000 mg | PACK | Freq: Four times a day (QID) | ORAL | Status: DC

## 2011-05-14 MED ORDER — DIVALPROEX SODIUM ER 500 MG PO TB24
1000.0000 mg | ORAL_TABLET | Freq: Every day | ORAL | Status: DC
  Administered 2011-05-15 – 2011-05-17 (×3): 1000 mg via ORAL
  Filled 2011-05-14 (×3): qty 2

## 2011-05-14 MED ORDER — METHYLPREDNISOLONE 4 MG PO KIT
4.0000 mg | PACK | ORAL | Status: AC
  Administered 2011-05-14: 4 mg via ORAL

## 2011-05-14 MED ORDER — HYDROMORPHONE HCL PF 1 MG/ML IJ SOLN
1.0000 mg | INTRAMUSCULAR | Status: DC | PRN
  Administered 2011-05-14 – 2011-05-17 (×10): 1 mg via INTRAVENOUS
  Filled 2011-05-14 (×10): qty 1

## 2011-05-14 MED ORDER — METHYLPREDNISOLONE 4 MG PO KIT
8.0000 mg | PACK | Freq: Every evening | ORAL | Status: AC
  Administered 2011-05-14: 8 mg via ORAL

## 2011-05-14 MED ORDER — HYDROMORPHONE HCL PF 1 MG/ML IJ SOLN
INTRAMUSCULAR | Status: AC
  Administered 2011-05-14: 1 mg via INTRAVENOUS
  Filled 2011-05-14: qty 1

## 2011-05-14 MED ORDER — METHYLPREDNISOLONE 4 MG PO KIT
8.0000 mg | PACK | Freq: Every morning | ORAL | Status: AC
  Administered 2011-05-14: 8 mg via ORAL
  Filled 2011-05-14: qty 21

## 2011-05-14 MED ORDER — METHYLPREDNISOLONE 4 MG PO KIT
4.0000 mg | PACK | Freq: Three times a day (TID) | ORAL | Status: DC
  Administered 2011-05-15: 4 mg via ORAL

## 2011-05-14 NOTE — Progress Notes (Signed)
Chaplain Note:  Upon referral from nurse chaplain visited with pt.  Pt was resting in bed, awake, alert, and oriented.  Chaplain introduced Larry Padilla and was welcomed into room.  Chaplain provided spiritual comfort and support for pt.  Pt described a variety of life issues that are causing difficulty.  Pt expressed appreciation for chaplain support.  In the Chaplain's opinion, patient may benefit from a consult with social work.     05/14/11 1300  Clinical Encounter Type  Visited With Patient  Visit Type Spiritual support  Referral From Nurse  Spiritual Encounters  Spiritual Needs Emotional  Stress Factors  Patient Stress Factors Family relationships;Major life changes;Loss of control (Pt dealing with job loss, variety of life difficulties)  Family Stress Factors Major life changes    Verdie Shire, chaplain resident [phone]

## 2011-05-14 NOTE — Progress Notes (Signed)
Stroke Team Progress Note  HISTORY Larry Padilla is an 46 y.o. male who reports that for the past 2 months he has been having trouble with blurry vision and markedly elevated blood pressure. Went to bed normal last evening 05/11/2011 but awakened this morning 05/12/2011 unable to see to the right and with a severe headache behind the right eye. He describes the headache as sharp. There is no associated nausea and vomiting but there is photophobia and phonophobia. Patient attempted to take his BP meds and pain meds at home but after no relief presented for further evaluation. Head CT shows a left occipital intracerebral hemorrhage. Patient was not a TPA candidate secondary to hemorrhage. he was admitted to the neuro ICU for further evaluation and treatment. BP 191/104 Has h/o HT x 1 year not on meds till 1 month ago when found to have BP 220/110 and started lisinopril.  SUBJECTIVE Complains of continued headache and nausea, ? Related to morphine.BP well controlled. MRI shows stable occipital hematoma and no underlying masses or vascular lesions.  OBJECTIVE Filed Vitals:   05/14/11 0300 05/14/11 0400 05/14/11 0500 05/14/11 0600  BP: 147/92 150/93 126/84 142/92  Pulse: 60 62 59 63  Temp:      TempSrc:      Resp: 15 15 15 16   Height:      Weight:      SpO2: 95% 94% 95% 93%   CBG (last 3) No results found for this basename: GLUCAP:3 in the last 72 hours Intake/Output from previous day: 03/21 0701 - 03/22 0700 In: 1057.7 [P.O.:720; I.V.:267.7; IV Piggyback:70] Out: 2000 [Urine:2000]  IV Fluid Intake:     . niCARDipine Stopped (05/13/11 1200)   Medications    . valproate sodium  2 g Intravenous Once   Followed by  . divalproex  500 mg Oral Daily  . lisinopril  20 mg Oral Daily  . LORazepam  1 mg Intravenous Once  . pantoprazole  40 mg Oral QHS  . senna-docusate  1 tablet Oral BID  PRN acetaminophen, acetaminophen, morphine injection, ondansetron (ZOFRAN) IV, traMADol, DISCONTD:  traMADol  Diet:  Cardiac thin liquids Activity:  Up with assistance DVT Prophylaxis:  SCDs   Significant Diagnostic Studies: CBC    Component Value Date/Time   WBC 14.3* 05/12/2011 1520   RBC 5.13 05/12/2011 1520   HGB 17.0 05/12/2011 1624   HCT 50.0 05/12/2011 1624   PLT 308 05/12/2011 1520   MCV 86.5 05/12/2011 1520   MCH 30.8 05/12/2011 1520   MCHC 35.6 05/12/2011 1520   RDW 13.3 05/12/2011 1520   CMP    Component Value Date/Time   NA 139 05/12/2011 1624   K 3.7 05/12/2011 1624   CL 106 05/12/2011 1624   GLUCOSE 103* 05/12/2011 1624   BUN 15 05/12/2011 1624   CREATININE 0.90 05/12/2011 1624   COAGS Lab Results  Component Value Date   INR 0.97 05/12/2011   Lipid Panel    Component Value Date/Time   CHOL 230* 05/13/2011 0415   TRIG 172* 05/13/2011 0415   HDL 51 05/13/2011 0415   CHOLHDL 4.5 05/13/2011 0415   VLDL 34 05/13/2011 0415   LDLCALC 145* 05/13/2011 0415   HgbA1C  Lab Results  Component Value Date   HGBA1C 5.5 05/13/2011   Urine Drug Screen  No results found for this basename: labopia,  cocainscrnur,  labbenz,  amphetmu,  thcu,  labbarb    Alcohol Level No results found for this basename: eth   MRSA neg  CT of the brain 05/12/2011  Acute intraparenchymal hematoma left occipital lobe with volume measuring 8 ml.  Very mild surrounding edema.   MRI of the brain  05/13/2011  1.  Left occipital intraparenchymal hemorrhage has not significantly changed measuring 26 x 49 x 25 mm.  Surrounding edema.  No larger area of infarction.  I favor this is a hypertensive hemorrhage. 2.  Underlying intracranial artery dolichoectasia.  See MRA findings below. 3.  Scattered nonspecific but advanced for age subcortical white matter signal changes.     MRA of the brain  05/13/2011  1.  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 2.  No abnormal flow signal in the region of the left occipital hemorrhage.  Very  little associated mass effect on the adjacent distal left PCA branches. 3.  No intracranial artery stenosis or major branch occlusion.   2D Echocardiogram  EF 55-60% with no source of embolus. Nodular calcification of noncoronary cusp, Nodular calcification of anterior mitral leaflet  Carotid Doppler  No internal carotid artery stenosis bilaterally. Vertebrals with antegrade flow bilaterally.   CXR  Not ordered   EKG  Not ordered .   Physical Exam   Obese young caucasian male not in distress.afebrile. Distal pulses well felt. Neck is supple. No bruit. Hearing is normal. Cardiac exam no murmur  Or gallop. Lungs clear to auscultation. Abdomen soft nontender. Neurological exam Awake  Alert oriented x 3. Normal speech and language.eye movements full without nystagmus.Dense right homonymous hemianopia. Face symmetric. Tongue midline. Normal strength, tone, reflexes and coordination. Normal sensation. Gait deferred.   ASSESSMENT Mr. Larry Padilla is a 46 y.o. male with a left occipital hemorrhage, secondary to malignant hypertension. Patient with resultant loss of vision right and blurred vision left eye.  -malignant hypertension leading to left occipital hemorrhagic stroke. Strict control oh HT with BP goal below 180. -dyslipidemia, consider statin in the future -headache, continues, on depakote ER 500 daily, ultram prn -anxiety -nausea. ? Related to morphine  Hospital day # 2  TREATMENT/PLAN -change morphine to dilaudid. -transfer to the floor -increase depakote to 1000mg  daily  SHARON BIBY, AVNP, ANP-BC, GNP-BC [name] Pager: [phone] 05/14/2011 8:17 AM  Dr. Delia Heady, Stroke Center Medical Director, has personally reviewed chart, pertinent data, examined the patient and developed the plan of care.

## 2011-05-14 NOTE — Progress Notes (Signed)
At 0615 pt sat up to use the urinal, felt dizzy and nauseous.  He said he was having "tunnel vision".  Pupils were equal bilaterally and reactive.  He rec'd 4 mg of Zofran.  Dr. Roseanne Reno (MD on call) was notified and recommended Stroke Team address this issue during morning rounds.

## 2011-05-15 MED ORDER — LISINOPRIL 40 MG PO TABS
40.0000 mg | ORAL_TABLET | Freq: Two times a day (BID) | ORAL | Status: DC
  Administered 2011-05-16 – 2011-05-17 (×3): 40 mg via ORAL
  Filled 2011-05-15 (×4): qty 1

## 2011-05-15 MED ORDER — LISINOPRIL 20 MG PO TABS
20.0000 mg | ORAL_TABLET | Freq: Once | ORAL | Status: AC
  Administered 2011-05-15: 20 mg via ORAL
  Filled 2011-05-15: qty 1

## 2011-05-15 MED ORDER — LISINOPRIL 40 MG PO TABS
40.0000 mg | ORAL_TABLET | Freq: Once | ORAL | Status: AC
  Administered 2011-05-15: 40 mg via ORAL
  Filled 2011-05-15: qty 1

## 2011-05-15 MED ORDER — ATORVASTATIN CALCIUM 20 MG PO TABS
20.0000 mg | ORAL_TABLET | Freq: Every day | ORAL | Status: DC
  Administered 2011-05-15 – 2011-05-16 (×2): 20 mg via ORAL
  Filled 2011-05-15 (×3): qty 1

## 2011-05-15 MED ORDER — HYDROCHLOROTHIAZIDE 25 MG PO TABS
25.0000 mg | ORAL_TABLET | Freq: Every day | ORAL | Status: DC
  Administered 2011-05-15 – 2011-05-17 (×3): 25 mg via ORAL
  Filled 2011-05-15 (×3): qty 1

## 2011-05-15 MED ORDER — HYDRALAZINE HCL 20 MG/ML IJ SOLN
10.0000 mg | Freq: Once | INTRAMUSCULAR | Status: AC
  Administered 2011-05-15: 10 mg via INTRAVENOUS
  Filled 2011-05-15: qty 0.5

## 2011-05-15 MED ORDER — LISINOPRIL 40 MG PO TABS
40.0000 mg | ORAL_TABLET | Freq: Every day | ORAL | Status: DC
  Filled 2011-05-15: qty 1

## 2011-05-15 MED ORDER — ALPRAZOLAM 0.25 MG PO TABS
0.2500 mg | ORAL_TABLET | Freq: Three times a day (TID) | ORAL | Status: DC | PRN
  Administered 2011-05-15 – 2011-05-17 (×5): 0.25 mg via ORAL
  Filled 2011-05-15 (×5): qty 1

## 2011-05-15 NOTE — Progress Notes (Signed)
History: Larry Padilla is an 46 y.o. male who reports that for the past 2 months he has been having trouble with blurry vision and markedly elevated blood pressure. Went to bed normal last evening 05/11/2011 but awakened this morning 05/12/2011 unable to see to the right and with a severe headache behind the right eye. He describes the headache as sharp. There is no associated nausea and vomiting but there is photophobia and phonophobia. Patient attempted to take his BP meds and pain meds at home but after no relief presented for further evaluation. Head CT shows a left occipital intracerebral hemorrhage. Patient was not a TPA candidate secondary to hemorrhage. he was admitted to the neuro ICU for further evaluation and treatment. BP 191/104  Has h/o HT x 1 year not on meds till 1 month ago when found to have BP 220/110 and started lisinopril.    Subjective: I'm feeling ok.  Still a little 'loopy' feeling, like I'm on drugs.  Can't see peripherally OD and a central spot OS.  Makes walking a little difficult.  Objective: BP 160/87  Pulse 62  Temp(Src) 98 F (36.7 C) (Oral)  Resp 20  Ht 6\' 3"  (1.905 m)  Wt 145.1 kg (319 lb 14.2 oz)  BMI 39.98 kg/m2  SpO2 94%   CBGs No results found for this basename: GLUCAP:10 in the last 72 hours  Diet: routine, thin  Activity: up with assistance  DVT Prophylaxis: SCD's   Medications: Scheduled:   . divalproex  1,000 mg Oral Daily  . lisinopril  20 mg Oral Daily  . methylPREDNISolone  4 mg Oral PC lunch  . methylPREDNISolone  4 mg Oral PC supper  . methylPREDNISolone  4 mg Oral 3 x daily with food  . methylPREDNISolone  4 mg Oral 4X daily taper  . methylPREDNISolone  8 mg Oral AC breakfast  . methylPREDNISolone  8 mg Oral Nightly  . methylPREDNISolone  8 mg Oral Nightly  . pantoprazole  40 mg Oral QHS  . senna-docusate  1 tablet Oral BID    Neurologic Exam: Mental Status: Alert, oriented, thought content appropriate.  Speech fluent  without evidence of aphasia. Able to follow 3 step commands without difficulty. Cranial Nerves: II- Dense right homonymous hemianopia.  III/IV/VI-Visual fields full when looking down.  Pt unable to cross midline to the right in lateral and upward gaze. Pupils reactive bilaterally.  No nystagmus. V/VII-Smile symmetric VIII-hearing grossly intact IX/X-normal gag XI-bilateral shoulder shrug XII-midline tongue extension Motor: 5/5 bilaterally with normal tone and bulk Sensory: Light touch intact throughout, bilaterally Deep Tendon Reflexes: 2+ and symmetric throughout Plantars: Downgoing bilaterally Cerebellar: Normal finger-to-nose, normal rapid alternating movements and normal heel-to-shin test.   Lab Results: Basic Metabolic Panel:  Lab 05/12/11 4098  NA 139  K 3.7  CL 106  CO2 --  GLUCOSE 103*  BUN 15  CREATININE 0.90  CALCIUM --  MG --  PHOS --     Lab 05/12/11 1624 05/12/11 1520  WBC -- 14.3*  NEUTROABS -- --  HGB 17.0 15.8  HCT 50.0 44.4  MCV -- 86.5  PLT -- 308   Hemoglobin A1C:  Lab 05/13/11 0415  HGBA1C 5.5   Fasting Lipid Panel:  Lab 05/13/11 0415  CHOL 230*  HDL 51  LDLCALC 145*  TRIG 172*  CHOLHDL 4.5  LDLDIRECT --   Coagulation:  Lab 05/12/11 1520  LABPROT 13.1  INR 0.97   Study Results:  05/13/2011   MRI HEAD WITHOUT CONTRAST   Findings:  Intraparenchymal left occipital hemorrhage re-identified with heterogeneous T2 signal, mostly isointense T1 signal, and decreased T2* signal.  This encompasses 26 x 49 x 25 mm (approximate volume 16 ml).  Surrounding edema in the left occipital lobe.  Effacement of the left occipital horn.  No ventriculomegaly.  Associated diffusion abnormality which appears related to the blood products themselves, no larger or regional restricted diffusion.  No areas of occult supratentorial hemorrhage, but there are multiple punctate chronic micro hemorrhages suggested in the cerebellum on T2* (series 10 images three through  five).  Major intracranial vascular flow voids are preserved.  There is a degree of intracranial artery dolichoectasia, see MRA findings below.  Outside of the left occipital lobe there is scattered mostly subcortical T2 and FLAIR hyperintensity in the brain, advanced for age.  Negative pituitary, cervicomedullary junction visualized cervical spine.  Basilar cisterns remain normal.  Visualized bone marrow signal is within normal limits.  Visualized orbit soft tissues are within normal limits.  Minimal paranasal sinus mucosal thickening.  Mastoids are clear.  Negative scalp soft tissues.  IMPRESSION: 1.  Left occipital intraparenchymal hemorrhage has not significantly changed measuring 26 x 49 x 25 mm.  Surrounding edema.  No larger area of infarction.  I favor this is a hypertensive hemorrhage. 2.  Underlying intracranial artery dolichoectasia.  See MRA findings below. 3.  Scattered nonspecific but advanced for age subcortical white matter signal changes.   H.LEE HALL III, M.D.   05/13/11 MRA HEAD WITHOUT CONTRAST  Technique: Angiographic images of the Circle of Ameli Sangiovanni were obtained using MRA technique without  intravenous contrast.  Findings: Antegrade flow in the posterior circulation.  Codominant distal vertebral arteries.  Right PICA origin is within normal limits.  Left PICA origin is not included but the visualized left PICA is within normal limits.  Mild basilar artery dolichoectasia. No basilar stenosis.  Superior cerebellar artery and PCA origins are within normal limits.  Posterior communicating arteries are diminutive or absent.  Bilateral PCA branches are within normal limits.  There is only mild mass effect on the left P3 PCA branches related to the known hemorrhage.  No abnormal flow signal in the region of the hemorrhage.  Antegrade flow in both ICA siphons.  The left ICA siphon is dolichoectatic, but in part this is related to dominance of the left ACA.  The right ICA siphon has a more normal  caliber and is mildly tortuous.  No ICA stenosis.  Ophthalmic artery origins are within normal limits.  Carotid termini are within normal limits except for the dolichoectasia on the left.  Abdominal left ACA A1 segment is also ectatic. The right ACA A1 segment is very hypoplastic.  The anterior communicating artery is mildly ectatic.  Other visualized ACA branches are within normal limits. The MCA origins are within normal limits. Visualized bilateral MCA branches are ectatic, more so on the right and involving the right MCA trifurcation.  No MCA branch stenosis or occlusion is identified.  IMPRESSION: 1.  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 2.  No abnormal flow signal in the region of the left occipital hemorrhage.  Very little associated mass effect on the adjacent distal left PCA branches. 3.  No intracranial artery stenosis or major branch occlusion.   H.LEE HALL III, M.D.   05/13/11 Carotid Doppler: No significant plaques or stenosis noted in either carotid arteries bilaterally. Vertebral artery flow is antegrade bilaterally.  05/13/11 2 D  ECHO: Left ventricle:Wall thickness was increased in a pattern of moderate LVH. Systolic function was normal. The estimated ejection fraction was 55% to 60%. - Aortic valve: Nodular calcification of noncoronary cusp - Mitral valve: Nodular calcification of anterior mitralleaflet Calcified annulus. - Left atrium: The atrium was mildly dilated. - Atrial septum: No defect or patent foramen ovale was identified.  Therapies: PT: Patient at baseline level of functioning.  OT: Disturbance of vision; Visual/perceptual remediation/compensation  SLP: does not present with any overt acute speech, language or cognitive deficits   Assessment: Larry Padilla is a 46 y.o. male with  1.  left occipital hemorrhage, secondary to malignant hypertension. Patient with resultant loss of  vision right and blurred vision left eye.  2. 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 BP 160/87 3. dyslipidemia, LDL not at  goal <100.  LDL 145 4. Headache- improved on depakote ER 1000mg  daily, methylprednisone, ultram prn   Physical and occupational therapy have signed off.  Plan: 1. Discharge planning. 2. Start Lipitor 20mg  PO QHS 3. Increase lisinopril to 40 mg daily 4. Add hydrochlorothiazide 5. Possible discharge in the morning if the patient is doing well. The patient will consult with the case manager. The patient was not employed at the time of the stroke event.    LOS: 3 days   Marya Fossa PA-C [facility] [phone] 05/15/2011  11:34 AM Lesly Dukes

## 2011-05-16 ENCOUNTER — Inpatient Hospital Stay (HOSPITAL_COMMUNITY): Payer: Self-pay

## 2011-05-16 LAB — CBC
HCT: 47 % (ref 39.0–52.0)
MCHC: 33.6 g/dL (ref 30.0–36.0)
MCV: 89 fL (ref 78.0–100.0)
Platelets: 304 10*3/uL (ref 150–400)
RDW: 13.4 % (ref 11.5–15.5)

## 2011-05-16 LAB — BASIC METABOLIC PANEL
BUN: 20 mg/dL (ref 6–23)
Calcium: 9.5 mg/dL (ref 8.4–10.5)
Chloride: 96 mEq/L (ref 96–112)
Creatinine, Ser: 1.17 mg/dL (ref 0.50–1.35)
GFR calc Af Amer: 85 mL/min — ABNORMAL LOW (ref >90)

## 2011-05-16 MED ORDER — ONDANSETRON HCL 4 MG PO TABS
4.0000 mg | ORAL_TABLET | Freq: Three times a day (TID) | ORAL | Status: DC | PRN
  Administered 2011-05-17: 4 mg via ORAL
  Filled 2011-05-16: qty 1

## 2011-05-16 MED ORDER — POTASSIUM CHLORIDE 20 MEQ PO PACK
20.0000 meq | PACK | Freq: Every day | ORAL | Status: DC
  Filled 2011-05-16: qty 1

## 2011-05-16 MED ORDER — POTASSIUM CHLORIDE CRYS ER 20 MEQ PO TBCR
20.0000 meq | EXTENDED_RELEASE_TABLET | Freq: Every day | ORAL | Status: DC
  Administered 2011-05-16 – 2011-05-17 (×2): 20 meq via ORAL
  Filled 2011-05-16 (×2): qty 1

## 2011-05-16 NOTE — Progress Notes (Signed)
BP still elevated.  Dr. Roseanne Reno paged and ordered 10 mg hydralizine and 40 mg lisinopril stat.  Changed lisinopril to 40 mg bid instead of once g day.

## 2011-05-16 NOTE — Progress Notes (Signed)
BP extremely elevated- 206/125 paged dr Roseanne Reno who order 10mg  hydralizine IV stat.

## 2011-05-16 NOTE — Progress Notes (Signed)
History: Larry Padilla is an 46 y.o. male who reports that for the past 2 months he has been having trouble with blurry vision and markedly elevated blood pressure. Went to bed normal last evening 05/11/2011 but awakened this morning 05/12/2011 unable to see to the right and with a severe headache behind the right eye. He describes the headache as sharp. There is no associated nausea and vomiting but there is photophobia and phonophobia. Patient attempted to take his BP meds and pain meds at home but after no relief presented for further evaluation. Head CT shows a left occipital intracerebral hemorrhage. Patient was not a TPA candidate secondary to hemorrhage. he was admitted to the neuro ICU for further evaluation and treatment. BP 191/104  Has h/o HT x 1 year not on meds till 1 month ago when found to have BP 220/110 and started lisinopril.  Subjective: C/O waking with a 3-4 headache, with stabbing and pressure features behind eyes and forehead.  Has nausea.  No change in vision.  Objective: BP 160/101  Pulse 62  Temp(Src) 98 F (36.7 C) (Oral)  Resp 20  Ht 6\' 3"  (1.905 m)  Wt 145.1 kg (319 lb 14.2 oz)  BMI 39.98 kg/m2  SpO2 92%   CBGs  Diet: routine, thin  Activity: up with assistance  DVT Prophylaxis: SCD's  Medications: Scheduled:    . atorvastatin  20 mg Oral q1800  . divalproex  1,000 mg Oral Daily  . hydrALAZINE  10 mg Intravenous Once  . hydrALAZINE  10 mg Intravenous Once  . hydrochlorothiazide  25 mg Oral Daily  . lisinopril  20 mg Oral Once  . lisinopril  40 mg Oral Once  . lisinopril  40 mg Oral BID  . pantoprazole  40 mg Oral QHS  . senna-docusate  1 tablet Oral BID  . DISCONTD: lisinopril  20 mg Oral Daily  . DISCONTD: lisinopril  40 mg Oral Daily  . DISCONTD: methylPREDNISolone  4 mg Oral 3 x daily with food  . DISCONTD: methylPREDNISolone  4 mg Oral 4X daily taper  . DISCONTD: methylPREDNISolone  8 mg Oral Nightly    Neurologic Exam: Mental  Status: Alert, oriented, thought content appropriate.  Speech fluent without evidence of aphasia. Able to follow 3 step commands without difficulty. Cranial Nerves: II- Dense right homonymous hemianopia.  III/IV/VI-Visual fields full when looking down.  Pt unable to cross midline to the right in lateral and upward gaze. Pupils reactive bilaterally.  No nystagmus. V/VII-Smile symmetric VIII-hearing grossly intact IX/X-normal gag XI-bilateral shoulder shrug XII-midline tongue extension Motor: 5/5 bilaterally with normal tone and bulk Sensory: Light touch intact throughout, bilaterally Deep Tendon Reflexes: 2+ and symmetric throughout Plantars: Downgoing bilaterally Cerebellar: Normal finger-to-nose, normal rapid alternating movements and normal heel-to-shin test.   Lab Results: Basic Metabolic Panel:  Lab 05/12/11 3086  NA 139  K 3.7  CL 106  CO2 --  GLUCOSE 103*  BUN 15  CREATININE 0.90  CALCIUM --  MG --  PHOS --     Lab 05/12/11 1624 05/12/11 1520  WBC -- 14.3*  NEUTROABS -- --  HGB 17.0 15.8  HCT 50.0 44.4  MCV -- 86.5  PLT -- 308   Hemoglobin A1C:  Lab 05/13/11 0415  HGBA1C 5.5   Fasting Lipid Panel:  Lab 05/13/11 0415  CHOL 230*  HDL 51  LDLCALC 145*  TRIG 172*  CHOLHDL 4.5  LDLDIRECT --   Coagulation:  Lab 05/12/11 1520  LABPROT 13.1  INR 0.97  Study Results:  05/13/2011   MRI HEAD WITHOUT CONTRAST   Findings: Intraparenchymal left occipital hemorrhage re-identified with heterogeneous T2 signal, mostly isointense T1 signal, and decreased T2* signal.  This encompasses 26 x 49 x 25 mm (approximate volume 16 ml).  Surrounding edema in the left occipital lobe.  Effacement of the left occipital horn.  No ventriculomegaly.  Associated diffusion abnormality which appears related to the blood products themselves, no larger or regional restricted diffusion.  No areas of occult supratentorial hemorrhage, but there are multiple punctate chronic micro  hemorrhages suggested in the cerebellum on T2* (series 10 images three through five).  Major intracranial vascular flow voids are preserved.  There is a degree of intracranial artery dolichoectasia, see MRA findings below.  Outside of the left occipital lobe there is scattered mostly subcortical T2 and FLAIR hyperintensity in the brain, advanced for age.  Negative pituitary, cervicomedullary junction visualized cervical spine.  Basilar cisterns remain normal.  Visualized bone marrow signal is within normal limits.  Visualized orbit soft tissues are within normal limits.  Minimal paranasal sinus mucosal thickening.  Mastoids are clear.  Negative scalp soft tissues.  IMPRESSION: 1.  Left occipital intraparenchymal hemorrhage has not significantly changed measuring 26 x 49 x 25 mm.  Surrounding edema.  No larger area of infarction.  I favor this is a hypertensive hemorrhage. 2.  Underlying intracranial artery dolichoectasia.  See MRA findings below. 3.  Scattered nonspecific but advanced for age subcortical white matter signal changes.   H.LEE HALL III, M.D.   05/13/11 MRA HEAD WITHOUT CONTRAST  Technique: Angiographic images of the Circle of Alexia Dinger were obtained using MRA technique without  intravenous contrast.  Findings: Antegrade flow in the posterior circulation.  Codominant distal vertebral arteries.  Right PICA origin is within normal limits.  Left PICA origin is not included but the visualized left PICA is within normal limits.  Mild basilar artery dolichoectasia. No basilar stenosis.  Superior cerebellar artery and PCA origins are within normal limits.  Posterior communicating arteries are diminutive or absent.  Bilateral PCA branches are within normal limits.  There is only mild mass effect on the left P3 PCA branches related to the known hemorrhage.  No abnormal flow signal in the region of the hemorrhage.  Antegrade flow in both ICA siphons.  The left ICA siphon is dolichoectatic, but in part this is  related to dominance of the left ACA.  The right ICA siphon has a more normal caliber and is mildly tortuous.  No ICA stenosis.  Ophthalmic artery origins are within normal limits.  Carotid termini are within normal limits except for the dolichoectasia on the left.  Abdominal left ACA A1 segment is also ectatic. The right ACA A1 segment is very hypoplastic.  The anterior communicating artery is mildly ectatic.  Other visualized ACA branches are within normal limits. The MCA origins are within normal limits. Visualized bilateral MCA branches are ectatic, more so on the right and involving the right MCA trifurcation.  No MCA branch stenosis or occlusion is identified.  IMPRESSION: 1.  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 2.  No abnormal flow signal in the region of the left occipital hemorrhage.  Very little associated mass effect on the adjacent distal left PCA branches. 3.  No intracranial artery stenosis or major branch occlusion.   H.LEE HALL III, M.D.   05/13/11 Carotid Doppler: No significant plaques or stenosis noted in either  carotid arteries bilaterally. Vertebral artery flow is antegrade bilaterally.  05/13/11 2 D ECHO: Left ventricle:Wall thickness was increased in a pattern of moderate LVH. Systolic function was normal. The estimated ejection fraction was 55% to 60%. - Aortic valve: Nodular calcification of noncoronary cusp - Mitral valve: Nodular calcification of anterior mitralleaflet Calcified annulus. - Left atrium: The atrium was mildly dilated. - Atrial septum: No defect or patent foramen ovale was identified.  Therapies: PT: Patient at baseline level of functioning.  OT: Disturbance of vision; Visual/perceptual remediation/compensation  SLP: does not present with any overt acute speech, language or cognitive deficits   Assessment: Mr. Larry Padilla is a 46 y.o. male with  1.  left occipital  hemorrhage, secondary to malignant hypertension. Patient with resultant loss of vision right and blurred vision left eye.  2. 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 BP 160/87 3. dyslipidemia, LDL not at  goal <100.  LDL 145 4. Headache- worse this am; depakote ER 1000mg  daily, methylprednisone, ultram prn   Physical and occupational therapy have signed off.  Hypokalemic by blood work  Plan: 1. Discharge planning- not ready to go home today due to headache pain, and severe hypertension last evening. 2. Start Lipitor 20mg  PO QHS 3. Increase lisinopril to 40 mg daily 4. Add hydrochlorothiazide 5. The patient will consult with the case manager. The patient was not employed at the time of the stroke event. 6. Repeat head CT due to increased HA.  Patient has been seen and examined by Dr. Anne Hahn.   LOS: 4 days   Marya Fossa PA-C [facility] [phone] 05/16/2011  7:40 AM JERNEJCIC,TARA Chestine Spore

## 2011-05-16 NOTE — Progress Notes (Signed)
Clinical Social Work Department BRIEF PSYCHOSOCIAL ASSESSMENT 05/16/2011  Patient:  Larry Padilla,Larry Padilla     Account Number:  [id number]     Admit date:  05/12/2011  Clinical Social Worker:  Skip Mayer  Date/Time:  05/15/2011 02:00 PM  Referred by:  RN  Date Referred:  03/16/2011 Referred for  Other - See comment   Other Referral:   Financial concerns   Interview type:  Patient Other interview type:    PSYCHOSOCIAL DATA Living Status:  SIGNIFICANT OTHER Admitted from facility:   Level of care:   Primary support name:  Shelly Primary support relationship to patient:   Degree of support available:   Adequate per pt    CURRENT CONCERNS Current Concerns  Financial Resources   Other Concerns:    SOCIAL WORK ASSESSMENT / PLAN CSW met with pt re: financial concerns. Pt reports he has lost his job due to medical issues preventing work. Pt lives with his fiance who is pregnant. Pt requested information about emergency financial assist, disability info, and assist with hospital bill. CSW provided appropriate resources and emotional support. No other CSW needs reported or noted. CSW signing off.   Assessment/plan status:  Information/Referral to [facility] Other assessment/ plan:   Information/referral to community resources:   Multiple community resources provided for financial assist and disability.    PATIENT'S/FAMILY'S RESPONSE TO PLAN OF CARE: Pt verbalized understanding of all resources provided. Pt very appreciative of CSW assist and support.     Dellie Burns, MSW, [place] [phone] (weekend)

## 2011-05-16 NOTE — Progress Notes (Signed)
OT Cancellation Note   Treatment cancelled today due to patient receiving procedure (CT scan). Will re-attempt as time allows.  Thanks!  05/16/2011 Cipriano Mile OTR/L Pager [phone] Office [phone]

## 2011-05-16 NOTE — Progress Notes (Signed)
OT Cancellation Note   Treatment cancelled today. Pt beginning shower upon OT arrival.  Will re-attempt for further assessment of vision.  Thanks.  05/16/2011 Cipriano Mile OTR/L Pager [phone] Office [phone]

## 2011-05-17 DIAGNOSIS — H547 Unspecified visual loss: Secondary | ICD-10-CM | POA: Diagnosis present

## 2011-05-17 DIAGNOSIS — E785 Hyperlipidemia, unspecified: Secondary | ICD-10-CM | POA: Diagnosis present

## 2011-05-17 DIAGNOSIS — F419 Anxiety disorder, unspecified: Secondary | ICD-10-CM | POA: Diagnosis present

## 2011-05-17 DIAGNOSIS — IMO0001 Reserved for inherently not codable concepts without codable children: Secondary | ICD-10-CM | POA: Diagnosis present

## 2011-05-17 DIAGNOSIS — R110 Nausea: Secondary | ICD-10-CM | POA: Diagnosis not present

## 2011-05-17 LAB — CBC
MCH: 30.3 pg (ref 26.0–34.0)
MCHC: 34 g/dL (ref 30.0–36.0)
Platelets: 342 10*3/uL (ref 150–400)
RBC: 5.11 MIL/uL (ref 4.22–5.81)

## 2011-05-17 LAB — BASIC METABOLIC PANEL
Calcium: 9.4 mg/dL (ref 8.4–10.5)
GFR calc non Af Amer: 66 mL/min — ABNORMAL LOW (ref >90)
Potassium: 3.7 mEq/L (ref 3.5–5.1)
Sodium: 133 mEq/L — ABNORMAL LOW (ref 135–145)

## 2011-05-17 MED ORDER — HYDROCHLOROTHIAZIDE 25 MG PO TABS: 25.0000 mg | ORAL_TABLET | Freq: Every day | ORAL | Status: DC

## 2011-05-17 MED ORDER — POTASSIUM CHLORIDE CRYS ER 20 MEQ PO TBCR: 20.0000 meq | EXTENDED_RELEASE_TABLET | Freq: Every day | ORAL | Status: DC

## 2011-05-17 MED ORDER — ATORVASTATIN CALCIUM 20 MG PO TABS: 20.0000 mg | ORAL_TABLET | Freq: Every day | ORAL | Status: DC

## 2011-05-17 MED ORDER — LISINOPRIL 20 MG PO TABS: 40.0000 mg | ORAL_TABLET | Freq: Two times a day (BID) | ORAL | Status: DC

## 2011-05-17 MED ORDER — DIVALPROEX SODIUM ER 500 MG PO TB24: 1000.0000 mg | ORAL_TABLET | Freq: Every day | ORAL | Status: DC

## 2011-05-17 NOTE — Progress Notes (Signed)
CSW received phone call from RN regarding pt's request to see CSW. CSW met with pt to address concerns. Pt shared that he lost his job in January of 2013 and has no income or insurance. Pt also shared that his vision is impaired.  Pt shared that he does not have transportation home at discharge. Pt shared that he is interested in applying for disability and Medicaid. Pt also reported that does not have a way of paying for his medications.   CSW encouraged pt to follow up with [facility] DSS to inquire about any further resources or programs that pt may qualify. CSW provided resources on Optomet[facility]. CSW also provided supportive counseling to pt.   CSW contacted [employer] regarding Medicaid application and disability application, which financial counselor to follow up. CSW also contacted RNCM regarding assistance for pt to obtain medications and RNCM will follow. CSW will follow up on clothes and transportation home which, CSW provided approved cab voucher for transportation home and clothes to wear home.   CSW is signing off as no further needs identified. Pt to discharge home.   Dede Query, MSW, [facility] [phone]

## 2011-05-17 NOTE — Discharge Summary (Signed)
Stroke Discharge Summary  Patient ID: Larry Padilla   MRN: [id number]      DOB: 11-26-1965  Date of Admission: 05/12/2011 Date of Discharge: 05/17/2011  Attending Physician:  Darcella Cheshire, MD  Consulting Physician(s):     none   Patient's PCP:  Burnett Kanaris, PA, PA-C  Discharge Diagnoses:  Principal Problem:  *Intracerebral hemorrhage - left occipital hemorrhage, secondary to malignant hypertension.  Active Problems:  Headache  Hypertension, malignant  Obesity, class 2 BMI: Body mass index is 39.98 kg/(m^2).  -resultant loss of vision right and blurred vision left eye.  -dyslipidemia -anxiety  -nausea. Related to morphine  Past Medical History  Diagnosis Date  . Hypertension    History reviewed. No pertinent past surgical history.  Medication List  As of 05/17/2011  4:20 PM   TAKE these medications         atorvastatin 20 MG tablet   Commonly known as: LIPITOR   Take 1 tablet (20 mg total) by mouth daily at 6 PM.      divalproex 500 MG 24 hr tablet   Commonly known as: DEPAKOTE ER   Take 2 tablets (1,000 mg total) by mouth daily.      hydrochlorothiazide 25 MG tablet   Commonly known as: HYDRODIURIL   Take 1 tablet (25 mg total) by mouth daily.      lisinopril 20 MG tablet   Commonly known as: PRINIVIL,ZESTRIL   Take 2 tablets (40 mg total) by mouth 2 (two) times daily.      potassium chloride SA 20 MEQ tablet   Commonly known as: K-DUR,KLOR-CON   Take 1 tablet (20 mEq total) by mouth daily.      TYLENOL PO   Take 2 tablets by mouth daily.           LABORATORY STUDIES CBC    Component Value Date/Time   WBC 17.8* 05/17/2011 0446   RBC 5.11 05/17/2011 0446   HGB 15.5 05/17/2011 0446   HCT 45.6 05/17/2011 0446   PLT 342 05/17/2011 0446   MCV 89.2 05/17/2011 0446   MCH 30.3 05/17/2011 0446   MCHC 34.0 05/17/2011 0446   RDW 13.5 05/17/2011 0446   CMP    Component Value Date/Time   NA 133* 05/17/2011 0446   K 3.7 05/17/2011 0446   CL 93*  05/17/2011 0446   CO2 31 05/17/2011 0446   GLUCOSE 92 05/17/2011 0446   BUN 20 05/17/2011 0446   CREATININE 1.29 05/17/2011 0446   CALCIUM 9.4 05/17/2011 0446   GFRNONAA 66* 05/17/2011 0446   GFRAA 76* 05/17/2011 0446   COAGS Lab Results  Component Value Date   INR 0.97 05/12/2011   Lipid Panel    Component Value Date/Time   CHOL 230* 05/13/2011 0415   TRIG 172* 05/13/2011 0415   HDL 51 05/13/2011 0415   CHOLHDL 4.5 05/13/2011 0415   VLDL 34 05/13/2011 0415   LDLCALC 145* 05/13/2011 0415   HgbA1C  Lab Results  Component Value Date   HGBA1C 5.5 05/13/2011   Urine Drug Screen  No results found for this basename: labopia, cocainscrnur, labbenz, amphetmu, thcu, labbarb    Alcohol Level No results found for this basename: eth    MRSA neg  SIGNIFICANT DIAGNOSTIC STUDIES CT of the brain 05/12/2011 Acute intraparenchymal hematoma left occipital lobe with volume measuring 8 ml. Very mild surrounding edema.   MRI of the brain 05/13/2011 1. Left occipital intraparenchymal hemorrhage has not significantly changed measuring 26 x  49 x 25 mm. Surrounding edema. No larger area of infarction. I favor this is a hypertensive hemorrhage. 2. Underlying intracranial artery dolichoectasia. See MRA findings below. 3. Scattered nonspecific but advanced for age subcortical white matter signal changes.   MRA of the brain 05/13/2011 1. 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 2. No abnormal flow signal in the region of the left occipital hemorrhage. Very little associated mass effect on the adjacent distal left PCA branches. 3. No intracranial artery stenosis or major branch occlusion.   2D Echocardiogram EF 55-60% with no source of embolus. Nodular calcification of noncoronary cusp, Nodular calcification of anterior mitral leaflet  Carotid Doppler No internal carotid artery stenosis bilaterally. Vertebrals with antegrade flow  bilaterally.   CXR Not ordered   EKG Not ordered .     History of Present Illness  Larry Padilla is an 46 y.o. male who reports that for the past 2 months he has been having trouble with blurry vision and markedly elevated blood pressure. Went to bed normal last evening 05/11/2011 but awakened this morning 05/12/2011 unable to see to the right and with a severe headache behind the right eye. He describes the headache as sharp. There is no associated nausea and vomiting but there is photophobia and phonophobia. Has h/o HT x 1 year not on meds till 1 month ago when found to have BP 220/110 and started lisinopril. Patient attempted to take his BP meds and pain meds at home but after no relief presented for further evaluation. Head CT shows a left occipital intracerebral hemorrhage. Patient was not a TPA candidate secondary to hemorrhage. he was admitted to the neuro ICU for further evaluation and treatment. BP 191/104   Hospital Course MRI confirmed occipital hemorrhage. Hemorrhage was secondary to malignant hypertension. Patient was initially started on Cardene for BP control with good results. Once able to take POs, his lisinopril was resumed and HCTZ was added. Lisinopril was increased to 40mg  bid in hospital due to ongoing elevated BP.  Patient with continued stroke symptoms of loss of vision to the right and blurry vision to the left. He had continued headache secondary to the hemorrhage. He was placed on depacon, decadron, ultram for headache management. Decadron was discontinued in hospital. He will continue on depacon for ongoing headache management. He has been advised to avoid Viagra at time of discharge; recommended follow up with PCP. Patient with significant anxiety related to vision abnormality and dx of hemorrhage - he is uncertain if he will ever be able to work. Should better realize extent of deficit over the next 3-6 mos. He is interested in disability.  Patient has stroke risk factors  of hypertension and obesity, hyperlipidemia  Physical therapy, occupational therapy and speech therapy evaluated patient. Pt had no followup therapy needs.  Patient without employment, insurance. Case Manager was consulted who helped with meds and advised on medication followup at time of discharge.  Discharge Exam  Blood pressure 135/87, pulse 78, temperature 97.7 F (36.5 C), temperature source Oral, resp. rate 20, height 6\' 3"  (1.905 m), weight 145.1 kg (319 lb 14.2 oz), SpO2 95.00%. Obese young caucasian male not in distress.afebrile. Distal pulses well felt. Neck is supple. No bruit. Hearing is normal. Cardiac exam no murmur Or gallop. Lungs clear to auscultation. Abdomen soft nontender.  Neurological exam Awake Alert oriented x 3. Normal speech and language.eye movements full without nystagmus.Dense right homonymous hemianopia. Face symmetric. Tongue  midline. Normal strength, tone, reflexes and coordination. Normal sensation. Gait deferred.   Discharge Diet   Cardiac thin liquids  Discharge Plan   - Disposition:  home with family - Ongoing risk factor control by Primary Care Physician. - Risk factor recommendations:  Hypertension target range 130-140/70-80 Lipid range - LDL < 100 and checked every 6 months, fasting weight loss  - Follow-up FULBRIGHT,[place], PA, PA-C in 1 month. - Follow-up with Dr. Delia Heady in 2 months.  Signed Annie Main, AVNP, ANP-BC, [facility] Nurse Practitioner 05/17/2011, 4:20 PM  Dr. Delia Heady, Stroke Center Medical Director, has personally reviewed chart, pertinent data, examined the patient and developed the plan of care.

## 2011-05-17 NOTE — Progress Notes (Signed)
CARE MANAGEMENT NOTE 05/17/2011  Patient:  Larry Padilla,Larry Padilla   Account Number:  [id number]  Date Initiated:  05/17/2011  Documentation initiated by:  Vance Peper  Subjective/Objective Assessment:   46 yr old male s/p CVA     Action/Plan:   Discharge planning. Spoke with pt. HAs no insurance, eligible for the [employer] fund. sent scripts to [facility]. Instructed patient to go to [facility] for 4.00 meds.Social worker to obtain clothing and transportation home.   Anticipated DC Date:  05/17/2011   Anticipated DC Plan:  HOME/SELF CARE  In-house referral  Clinical Social Worker      DC Planning Services  CM consult  Medication Assistance      Status of service:  In process, will continue to follow Discharge Disposition:  HOME/SELF CARE  Per UR Regulation:    If discussed at Long Length of Stay Meetings, dates discussed:    Comments:  05/19/11 1645 Vance Peper, RN BSN Case Manabger Patient states he will follow up with his Primary MD, and Dr. Pearlean Brownie.

## 2011-05-17 NOTE — Progress Notes (Signed)
Stroke Team Progress Note  HISTORY Kathy Wares is an 46 y.o. male who reports that for the past 2 months he has been having trouble with blurry vision and markedly elevated blood pressure. Went to bed normal last evening 05/11/2011 but awakened this morning 05/12/2011 unable to see to the right and with a severe headache behind the right eye. He describes the headache as sharp. There is no associated nausea and vomiting but there is photophobia and phonophobia. Patient attempted to take his BP meds and pain meds at home but after no relief presented for further evaluation. Head CT shows a left occipital intracerebral hemorrhage. Patient was not a TPA candidate secondary to hemorrhage. he was admitted to the neuro ICU for further evaluation and treatment. BP 191/104 Has h/o HT x 1 year not on meds till 1 month ago when found to have BP 220/110 and started lisinopril.  SUBJECTIVE Patient concerned with medical bill and cost of medications.Headache improved but vision loss continues.  OBJECTIVE Filed Vitals:   05/16/11 2043 05/17/11 0215 05/17/11 0524 05/17/11 1000  BP: 133/86 140/88 113/73 123/84  Pulse: 69 68 89 66  Temp: 97.8 F (36.6 C) 97.6 F (36.4 C) 98.1 F (36.7 C) 97.3 F (36.3 C)  TempSrc: Oral Oral Oral Oral  Resp: 20 20 20 22   Height:      Weight:      SpO2: 92% 94% 94% 95%   CBG (last 3) No results found for this basename: GLUCAP:3 in the last 72 hours Intake/Output from previous day: 03/24 0701 - 03/25 0700 In: 360 [P.O.:360] Out: -   IV Fluid Intake:    Medications    . atorvastatin  20 mg Oral q1800  . divalproex  1,000 mg Oral Daily  . hydrochlorothiazide  25 mg Oral Daily  . lisinopril  40 mg Oral BID  . pantoprazole  40 mg Oral QHS  . potassium chloride  20 mEq Oral Daily  . senna-docusate  1 tablet Oral BID  PRN acetaminophen, acetaminophen, ALPRAZolam, HYDROmorphone (DILAUDID) injection, ondansetron (ZOFRAN) IV, ondansetron, traMADol  Diet:  Cardiac  thin liquids Activity:  Up with assistance DVT Prophylaxis:  SCDs   Significant Diagnostic Studies: CBC    Component Value Date/Time   WBC 17.8* 05/17/2011 0446   RBC 5.11 05/17/2011 0446   HGB 15.5 05/17/2011 0446   HCT 45.6 05/17/2011 0446   PLT 342 05/17/2011 0446   MCV 89.2 05/17/2011 0446   MCH 30.3 05/17/2011 0446   MCHC 34.0 05/17/2011 0446   RDW 13.5 05/17/2011 0446   CMP    Component Value Date/Time   NA 133* 05/17/2011 0446   K 3.7 05/17/2011 0446   CL 93* 05/17/2011 0446   CO2 31 05/17/2011 0446   GLUCOSE 92 05/17/2011 0446   BUN 20 05/17/2011 0446   CREATININE 1.29 05/17/2011 0446   CALCIUM 9.4 05/17/2011 0446   GFRNONAA 66* 05/17/2011 0446   GFRAA 76* 05/17/2011 0446   COAGS Lab Results  Component Value Date   INR 0.97 05/12/2011   Lipid Panel    Component Value Date/Time   CHOL 230* 05/13/2011 0415   TRIG 172* 05/13/2011 0415   HDL 51 05/13/2011 0415   CHOLHDL 4.5 05/13/2011 0415   VLDL 34 05/13/2011 0415   LDLCALC 145* 05/13/2011 0415   HgbA1C  Lab Results  Component Value Date   HGBA1C 5.5 05/13/2011   Urine Drug Screen  No results found for this basename: labopia,  cocainscrnur,  labbenz,  amphetmu,  thcu,  labbarb    Alcohol Level No results found for this basename: eth   MRSA neg   CT of the brain 05/12/2011  Acute intraparenchymal hematoma left occipital lobe with volume measuring 8 ml.  Very mild surrounding edema.   MRI of the brain  05/13/2011  1.  Left occipital intraparenchymal hemorrhage has not significantly changed measuring 26 x 49 x 25 mm.  Surrounding edema.  No larger area of infarction.  I favor this is a hypertensive hemorrhage. 2.  Underlying intracranial artery dolichoectasia.  See MRA findings below. 3.  Scattered nonspecific but advanced for age subcortical white matter signal changes.     MRA of the brain  05/13/2011  1.  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 2.  No abnormal flow signal in the region of the left occipital hemorrhage.  Very little associated mass effect on the adjacent distal left PCA branches. 3.  No intracranial artery stenosis or major branch occlusion.   2D Echocardiogram  EF 55-60% with no source of embolus. Nodular calcification of noncoronary cusp, Nodular calcification of anterior mitral leaflet  Carotid Doppler  No internal carotid artery stenosis bilaterally. Vertebrals with antegrade flow bilaterally.   CXR  Not ordered   EKG  Not ordered .   Physical Exam   Obese young caucasian male not in distress.afebrile. Distal pulses well felt. Neck is supple. No bruit. Hearing is normal. Cardiac exam no murmur  Or gallop. Lungs clear to auscultation. Abdomen soft nontender. Neurological exam Awake  Alert oriented x 3. Normal speech and language.eye movements full without nystagmus.Dense right homonymous hemianopia. Face symmetric. Tongue midline. Normal strength, tone, reflexes and coordination. Normal sensation. Gait deferred.   ASSESSMENT Mr. Pancho Rushing is a 46 y.o. male with a left occipital hemorrhage, secondary to malignant hypertension. Patient with resultant loss of vision right and blurred vision left eye.  -malignant hypertension leading to left occipital hemorrhagic stroke. Strict control of HT with BP goal below 180. -dyslipidemia, consider statin in the future -headache, continues, on depakote ER 500 daily, ultram prn -anxiety -nausea. ? Related to morphine  Hospital day # 5  TREATMENT/PLAN -discharge home -follow up Dr. Pearlean Brownie in 1 mo -follow up PCP in 1 week -care manager to assist pt with financial issues  SHARON BIBY, AVNP, ANP-BC, GNP-BC [name] Pager: [phone] 05/17/2011 11:57 AM  Dr. Delia Heady, Stroke Center Medical Director, has personally reviewed chart, pertinent data, examined the patient and developed the plan of care.

## 2011-05-17 NOTE — Discharge Instructions (Signed)
STROKE/TIA DISCHARGE INSTRUCTIONS SMOKING Cigarette smoking nearly doubles your risk of having a stroke & is the single most alterable risk factor  If you smoke or have smoked in the last 12 months, you are advised to quit smoking for your health.  Most of the excess cardiovascular risk related to smoking disappears within a year of stopping.  Ask you doctor about anti-smoking medications  [place] Quit Line: 1-800-QUIT NOW  Free Smoking Cessation Classes [phone]  CHOLESTEROL Know your levels; limit fat & cholesterol in your diet  Lipid Panel     Component Value Date/Time   CHOL 230* 05/13/2011 0415   TRIG 172* 05/13/2011 0415   HDL 51 05/13/2011 0415   CHOLHDL 4.5 05/13/2011 0415   VLDL 34 05/13/2011 0415   LDLCALC 145* 05/13/2011 0415      Many patients benefit from treatment even if their cholesterol is at goal.  Goal: Total Cholesterol (CHOL) less than 160  Goal:  Triglycerides (TRIG) less than 150  Goal:  HDL greater than 40  Goal:  LDL (LDLCALC) less than 100   BLOOD PRESSURE American Stroke Association blood pressure target is less that 120/80 mm/Hg  Your discharge blood pressure is:  BP: 135/87 mmHg  Monitor your blood pressure  Limit your salt and alcohol intake  Many individuals will require more than one medication for high blood pressure  DIABETES (A1c is a blood sugar average for last 3 months) Goal HGBA1c is under 7% (HBGA1c is blood sugar average for last 3 months)  Diabetes: No known diagnosis of diabetes    Lab Results  Component Value Date   HGBA1C 5.5 05/13/2011     Your HGBA1c can be lowered with medications, healthy diet, and exercise.  Check your blood sugar as directed by your physician  Call your physician if you experience unexplained or low blood sugars.  PHYSICAL ACTIVITY/REHABILITATION Goal is 30 minutes at least 4 days per week    Activity:   Increase activity slowly, and No driving while taking pain medications Return to work:  when  symptoms resolve  Activity decreases your risk of heart attack and stroke and makes your heart stronger.  It helps control your weight and blood pressure; helps you relax and can improve your mood.  Participate in a regular exercise program.  Talk with your doctor about the best form of exercise for you (dancing, walking, swimming, cycling).  DIET/WEIGHT Goal is to maintain a healthy weight  Your discharge diet is: Cardiac thin liquids Your height is:  Height: 6\' 3"  (190.5 cm) Your current weight is: Weight: 145.1 kg (319 lb 14.2 oz) Your Body Mass Index (BMI) is:  BMI (Calculated): 40.1   Following the type of diet specifically designed for you will help prevent another stroke.  Your goal weight range is:  ***  Your goal Body Mass Index (BMI) is 19-24.  Healthy food habits can help reduce 3 risk factors for stroke:  High cholesterol, hypertension, and excess weight.  RESOURCES Stroke/Support Group:  Call [phone]  they meet the 3rd Sunday of the month on the Rehab Unit at [facility], [place] ( no meetings June, July & Aug).  STROKE EDUCATION PROVIDED/REVIEWED AND GIVEN TO PATIENT Stroke warning signs and symptoms How to activate emergency medical system (call 911). Medications prescribed at discharge. Need for follow-up after discharge. Personal risk factors for stroke. Pneumonia vaccine given:  No Flu vaccine given:  Declined My questions have been answered, the writing is legible, and I understand these  instructions.  I will adhere to these goals & educational materials that have been provided to me after my discharge from the hospital.

## 2011-05-19 ENCOUNTER — Emergency Department (HOSPITAL_COMMUNITY): Payer: Self-pay

## 2011-05-19 ENCOUNTER — Emergency Department (HOSPITAL_COMMUNITY)
Admission: EM | Admit: 2011-05-19 | Discharge: 2011-05-20 | Disposition: A | Discharge status: Home / Self Care | Payer: Self-pay | Attending: Emergency Medicine | Admitting: Emergency Medicine

## 2011-05-19 ENCOUNTER — Encounter (HOSPITAL_COMMUNITY): Payer: Self-pay | Admitting: Emergency Medicine

## 2011-05-19 DIAGNOSIS — Z9889 Other specified postprocedural states: Secondary | ICD-10-CM | POA: Insufficient documentation

## 2011-05-19 DIAGNOSIS — I10 Essential (primary) hypertension: Secondary | ICD-10-CM | POA: Insufficient documentation

## 2011-05-19 DIAGNOSIS — H539 Unspecified visual disturbance: Secondary | ICD-10-CM | POA: Insufficient documentation

## 2011-05-19 DIAGNOSIS — Z8673 Personal history of transient ischemic attack (TIA), and cerebral infarction without residual deficits: Secondary | ICD-10-CM | POA: Insufficient documentation

## 2011-05-19 DIAGNOSIS — R51 Headache: Secondary | ICD-10-CM | POA: Insufficient documentation

## 2011-05-19 DIAGNOSIS — Z79899 Other long term (current) drug therapy: Secondary | ICD-10-CM | POA: Insufficient documentation

## 2011-05-19 HISTORY — DX: Cerebral infarction, unspecified: I63.9

## 2011-05-19 LAB — CBC
HCT: 45.7 % (ref 39.0–52.0)
Hemoglobin: 15.8 g/dL (ref 13.0–17.0)
MCHC: 34.6 g/dL (ref 30.0–36.0)
RBC: 5.15 MIL/uL (ref 4.22–5.81)
WBC: 15.6 10*3/uL — ABNORMAL HIGH (ref 4.0–10.5)

## 2011-05-19 LAB — PROTIME-INR: INR: 0.94 (ref 0.00–1.49)

## 2011-05-19 LAB — DIFFERENTIAL
Basophils Relative: 0 % (ref 0–1)
Lymphocytes Relative: 25 % (ref 12–46)
Lymphs Abs: 3.9 10*3/uL (ref 0.7–4.0)
Monocytes Relative: 12 % (ref 3–12)
Neutro Abs: 9.6 10*3/uL — ABNORMAL HIGH (ref 1.7–7.7)
Neutrophils Relative %: 62 % (ref 43–77)

## 2011-05-19 LAB — POCT I-STAT, CHEM 8
BUN: 23 mg/dL (ref 6–23)
Potassium: 4.4 mEq/L (ref 3.5–5.1)
Sodium: 141 mEq/L (ref 135–145)
TCO2: 30 mmol/L (ref 0–100)

## 2011-05-19 MED ORDER — HYDROCODONE-ACETAMINOPHEN 5-500 MG PO TABS: 1.0000 | ORAL_TABLET | Freq: Four times a day (QID) | ORAL | Status: AC | PRN

## 2011-05-19 MED ORDER — LABETALOL HCL 5 MG/ML IV SOLN
20.0000 mg | Freq: Once | INTRAVENOUS | Status: AC
  Administered 2011-05-19: 20 mg via INTRAVENOUS
  Filled 2011-05-19: qty 4

## 2011-05-19 MED ORDER — SODIUM CHLORIDE 0.9 % IV BOLUS (SEPSIS)
1000.0000 mL | Freq: Once | INTRAVENOUS | Status: AC
  Administered 2011-05-19: 1000 mL via INTRAVENOUS

## 2011-05-19 MED ORDER — DIPHENHYDRAMINE HCL 50 MG/ML IJ SOLN
12.5000 mg | Freq: Once | INTRAMUSCULAR | Status: AC
  Administered 2011-05-19: 12.5 mg via INTRAVENOUS
  Filled 2011-05-19: qty 1

## 2011-05-19 MED ORDER — DIPHENHYDRAMINE HCL 25 MG PO CAPS
50.0000 mg | ORAL_CAPSULE | Freq: Once | ORAL | Status: AC
  Administered 2011-05-19: 50 mg via ORAL
  Filled 2011-05-19: qty 2

## 2011-05-19 MED ORDER — DEXAMETHASONE 6 MG PO TABS
10.0000 mg | ORAL_TABLET | ORAL | Status: AC
  Administered 2011-05-19: 10 mg via ORAL
  Filled 2011-05-19: qty 1

## 2011-05-19 MED ORDER — METOCLOPRAMIDE HCL 5 MG/ML IJ SOLN
10.0000 mg | Freq: Once | INTRAMUSCULAR | Status: AC
  Administered 2011-05-19: 10 mg via INTRAVENOUS
  Filled 2011-05-19: qty 2

## 2011-05-19 MED ORDER — HYDROMORPHONE HCL PF 1 MG/ML IJ SOLN
1.0000 mg | Freq: Once | INTRAMUSCULAR | Status: AC
  Administered 2011-05-19: 1 mg via INTRAVENOUS
  Filled 2011-05-19: qty 1

## 2011-05-19 MED ORDER — LABETALOL HCL 5 MG/ML IV SOLN: 10.0000 mg | Freq: Once | INTRAVENOUS | Status: DC

## 2011-05-19 NOTE — ED Notes (Signed)
Meds given, lights turned down, call light at bedside.

## 2011-05-19 NOTE — ED Notes (Signed)
Pt to ED via [facility] with c/o severe headache and HTN.  Pt recently d'cd from hosp ref. CVA and was told to return for headache or high blood pressure.

## 2011-05-19 NOTE — Discharge Instructions (Signed)
Return if your headache or blood pressure get worse, or if you notice new weakness, trouble walking, thinking or trouble with your speech. Follow up with [facility] tomorrow.

## 2011-05-19 NOTE — ED Provider Notes (Signed)
History     CSN: [id number]  Arrival date & time 05/19/11  2049   First MD Initiated Contact with Patient 05/19/11 2100      Chief Complaint  Patient presents with  . Headache    (Consider location/radiation/quality/duration/timing/severity/associated sxs/prior treatment) Patient is a 46 y.o. male presenting with headaches. The history is provided by the patient.  Headache  This is a recurrent problem. The current episode started 6 to 12 hours ago. The problem occurs constantly. The problem has been rapidly worsening. The headache is associated with bright light. The pain is located in the right unilateral region. The quality of the pain is described as throbbing. The pain is at a severity of 9/10. The pain is severe. The pain does not radiate. Pertinent negatives include no fever, no shortness of breath, no nausea and no vomiting. He has tried nothing for the symptoms.    Past Medical History  Diagnosis Date  . Hypertension   . CVA (cerebral vascular accident)     Past Surgical History  Procedure Date  . Back surgery     No family history on file.  History  Substance Use Topics  . Smoking status: Never Smoker   . Smokeless tobacco: Never Used  . Alcohol Use: No      Review of Systems  Constitutional: Negative for fever and chills.  HENT: Negative for congestion, rhinorrhea and neck pain.   Eyes: Positive for visual disturbance (unchanged for past week).  Respiratory: Negative for cough and shortness of breath.   Cardiovascular: Negative for chest pain and leg swelling.  Gastrointestinal: Negative for nausea, vomiting, abdominal pain, constipation and blood in stool.  Genitourinary: Negative for dysuria and decreased urine volume.  Neurological: Positive for headaches. Negative for weakness and numbness.  Psychiatric/Behavioral: Negative for confusion.  All other systems reviewed and are negative.    Allergies  Review of patient's allergies indicates no known  allergies.  Home Medications   Current Outpatient Rx  Name Route Sig Dispense Refill  . TYLENOL PO Oral Take 2 tablets by mouth daily.    . ATORVASTATIN CALCIUM 20 MG PO TABS Oral Take 20 mg by mouth daily at 6 PM.    . DIVALPROEX SODIUM ER 500 MG PO TB24 Oral Take 1,000 mg by mouth daily.    Marland Kitchen HYDROCHLOROTHIAZIDE 25 MG PO TABS Oral Take 25 mg by mouth daily.    Marland Kitchen LISINOPRIL 20 MG PO TABS Oral Take 40 mg by mouth 2 (two) times daily.    Marland Kitchen POTASSIUM CHLORIDE CRYS ER 20 MEQ PO TBCR Oral Take 20 mEq by mouth daily.      BP 165/107  Pulse 81  Temp(Src) 98.4 F (36.9 C) (Oral)  Resp 16  SpO2 99%  Physical Exam  Nursing note and vitals reviewed. Constitutional: He is oriented to person, place, and time. He appears well-developed and well-nourished.  HENT:  Head: Normocephalic and atraumatic.  Right Ear: External ear normal.  Left Ear: External ear normal.  Nose: Nose normal.  Neck: Neck supple.  Cardiovascular: Normal rate, regular rhythm, normal heart sounds and intact distal pulses.   Pulmonary/Chest: Effort normal and breath sounds normal. No respiratory distress. He has no wheezes. He has no rales.  Abdominal: Soft. He exhibits no distension and no mass. There is no tenderness. There is no rebound and no guarding.  Musculoskeletal: He exhibits no edema.  Lymphadenopathy:    He has no cervical adenopathy.  Neurological: He is alert and oriented to person,  place, and time. He has normal strength. No cranial nerve deficit or sensory deficit. He exhibits normal muscle tone. GCS eye subscore is 4. GCS verbal subscore is 5. GCS motor subscore is 6.  Skin: Skin is warm and dry.    ED Course  Procedures (including critical care time)  Labs Reviewed  CBC - Abnormal; Notable for the following:    WBC 15.6 (*)    All other components within normal limits  DIFFERENTIAL - Abnormal; Notable for the following:    Neutro Abs 9.6 (*)    Monocytes Absolute 1.9 (*)    All other  components within normal limits  PROTIME-INR  POCT I-STAT, CHEM 8   Ct Head Wo Contrast  05/19/2011  *RADIOLOGY REPORT*  Clinical Data: 46 year old male with headache.  Known recent left occipital intraparenchymal hemorrhage.  CT HEAD WITHOUT CONTRAST  Technique:  Contiguous axial images were obtained from the base of the skull through the vertex without contrast.  Comparison: 05/16/2011 and 05/12/2011 CTs.  05/13/2011 MRI  Findings: A 2.1 x 4.9 cm left occipital hematoma with adjacent parenchymal edema is unchanged.  There is no evidence of new hemorrhage, acute infarction, midline shift or hydrocephalus. The bony calvarium is unremarkable.  IMPRESSION: Stable 2.1 x 4.9 cm left occipital hematoma with adjacent parenchymal edema.  No evidence of acute abnormality.  Original Report Authenticated By: Rosendo Gros, M.D.     1. Headache       MDM  46 yo male with recent left occipital IPH, treated medically in the hospital by neurology 1-2 weeks ago (Dr. Conley Rolls). Had several deficits from this hemorrhagic stroke, including visual field deficits, which are unchanged today. Now having worse pain but on the right side of his head, and noticed his BP was up. CT head shows unchanged hematoma in left occipital brain with edema, but no new bleed or change. Pain only able to be controlled when decreasing BP with labetalol. Discussed with on call neurologist, who is ok with D/C as long as no new neuro deficits (his neuro exam is unchanged) and BP is controlled.  BP well controlled in ED after pain control and 20 mg labetalol, and headache near his baseline from when he left hospital. Discussed strict return precautions, and that he needs to follow up with PA that discharged him ASAP tomorrow for close follow up and good outpatient BP control. With pain better and BP controlled, as well as well appearing and no focal neuro issues with stable CT, feel patient is OK for discharge.        Pricilla Loveless,  MD 05/20/11 [phone]

## 2011-05-19 NOTE — ED Notes (Signed)
PT states pain still an 8

## 2011-05-20 NOTE — ED Notes (Signed)
Placed call for taxicab back home

## 2011-05-20 NOTE — ED Provider Notes (Signed)
I saw and evaluated the patient, reviewed the resident's note and I agree with the findings and plan.   Ayaka Andes, MD 05/20/11 1716 

## 2011-11-18 ENCOUNTER — Emergency Department (HOSPITAL_COMMUNITY): Payer: Self-pay

## 2011-11-18 ENCOUNTER — Inpatient Hospital Stay (HOSPITAL_COMMUNITY)
Admission: EM | Admit: 2011-11-18 | Discharge: 2011-11-19 | DRG: 305 | Disposition: A | Discharge status: Home / Self Care | Payer: MEDICAID | Attending: Family Medicine | Admitting: Family Medicine

## 2011-11-18 ENCOUNTER — Encounter (HOSPITAL_COMMUNITY): Payer: Self-pay | Admitting: Emergency Medicine

## 2011-11-18 DIAGNOSIS — Z8673 Personal history of transient ischemic attack (TIA), and cerebral infarction without residual deficits: Secondary | ICD-10-CM

## 2011-11-18 DIAGNOSIS — E876 Hypokalemia: Secondary | ICD-10-CM | POA: Diagnosis present

## 2011-11-18 DIAGNOSIS — R519 Headache, unspecified: Secondary | ICD-10-CM | POA: Diagnosis present

## 2011-11-18 DIAGNOSIS — E785 Hyperlipidemia, unspecified: Secondary | ICD-10-CM

## 2011-11-18 DIAGNOSIS — Z9119 Patient's noncompliance with other medical treatment and regimen: Secondary | ICD-10-CM

## 2011-11-18 DIAGNOSIS — IMO0001 Reserved for inherently not codable concepts without codable children: Secondary | ICD-10-CM | POA: Diagnosis present

## 2011-11-18 DIAGNOSIS — Z6837 Body mass index (BMI) 37.0-37.9, adult: Secondary | ICD-10-CM

## 2011-11-18 DIAGNOSIS — I10 Essential (primary) hypertension: Secondary | ICD-10-CM

## 2011-11-18 DIAGNOSIS — F411 Generalized anxiety disorder: Secondary | ICD-10-CM | POA: Diagnosis present

## 2011-11-18 DIAGNOSIS — Z79899 Other long term (current) drug therapy: Secondary | ICD-10-CM

## 2011-11-18 DIAGNOSIS — Z91199 Patient's noncompliance with other medical treatment and regimen due to unspecified reason: Secondary | ICD-10-CM

## 2011-11-18 DIAGNOSIS — R51 Headache: Secondary | ICD-10-CM | POA: Diagnosis present

## 2011-11-18 DIAGNOSIS — E669 Obesity, unspecified: Secondary | ICD-10-CM | POA: Diagnosis present

## 2011-11-18 HISTORY — DX: Hyperlipidemia, unspecified: E78.5

## 2011-11-18 LAB — GLUCOSE, CAPILLARY: Glucose-Capillary: 102 mg/dL — ABNORMAL HIGH (ref 70–99)

## 2011-11-18 LAB — DIFFERENTIAL
Basophils Relative: 0 % (ref 0–1)
Lymphocytes Relative: 23 % (ref 12–46)
Monocytes Absolute: 1 10*3/uL (ref 0.1–1.0)
Monocytes Relative: 9 % (ref 3–12)
Neutro Abs: 8 10*3/uL — ABNORMAL HIGH (ref 1.7–7.7)

## 2011-11-18 LAB — TROPONIN I: Troponin I: <0.3 ng/mL (ref <0.30)

## 2011-11-18 LAB — COMPREHENSIVE METABOLIC PANEL
BUN: 17 mg/dL (ref 6–23)
CO2: 25 mEq/L (ref 19–32)
Chloride: 103 mEq/L (ref 96–112)
Creatinine, Ser: 1.03 mg/dL (ref 0.50–1.35)
GFR calc Af Amer: >90 mL/min (ref >90)
GFR calc non Af Amer: 86 mL/min — ABNORMAL LOW (ref >90)
Glucose, Bld: 97 mg/dL (ref 70–99)
Total Bilirubin: 0.3 mg/dL (ref 0.3–1.2)

## 2011-11-18 LAB — APTT: aPTT: 33 seconds (ref 24–37)

## 2011-11-18 LAB — CBC
HCT: 45.3 % (ref 39.0–52.0)
Hemoglobin: 15.7 g/dL (ref 13.0–17.0)
MCHC: 34.7 g/dL (ref 30.0–36.0)

## 2011-11-18 MED ORDER — HYDRALAZINE HCL 20 MG/ML IJ SOLN
10.0000 mg | INTRAMUSCULAR | Status: DC | PRN
  Administered 2011-11-18 – 2011-11-19 (×3): 10 mg via INTRAVENOUS
  Filled 2011-11-18 (×3): qty 1

## 2011-11-18 MED ORDER — LABETALOL HCL 5 MG/ML IV SOLN
20.0000 mg | INTRAVENOUS | Status: DC | PRN
Start: 2011-11-18 — End: 2011-11-18
  Administered 2011-11-18: 20 mg via INTRAVENOUS
  Filled 2011-11-18: qty 4

## 2011-11-18 MED ORDER — LISINOPRIL 40 MG PO TABS
40.0000 mg | ORAL_TABLET | Freq: Every day | ORAL | Status: DC
  Administered 2011-11-19: 40 mg via ORAL
  Filled 2011-11-18 (×3): qty 1

## 2011-11-18 MED ORDER — ACETAMINOPHEN 325 MG PO TABS: 650.0000 mg | ORAL_TABLET | Freq: Four times a day (QID) | ORAL | Status: DC | PRN

## 2011-11-18 MED ORDER — ONDANSETRON HCL 4 MG PO TABS
4.0000 mg | ORAL_TABLET | Freq: Four times a day (QID) | ORAL | Status: DC | PRN
  Administered 2011-11-19: 4 mg via ORAL
  Filled 2011-11-18: qty 1

## 2011-11-18 MED ORDER — SODIUM CHLORIDE 0.9 % IJ SOLN
3.0000 mL | Freq: Two times a day (BID) | INTRAMUSCULAR | Status: DC
  Administered 2011-11-19: 3 mL via INTRAVENOUS

## 2011-11-18 MED ORDER — HYDROCODONE-ACETAMINOPHEN 5-325 MG PO TABS
1.0000 | ORAL_TABLET | ORAL | Status: DC | PRN
  Administered 2011-11-18 – 2011-11-19 (×2): 2 via ORAL
  Filled 2011-11-18 (×2): qty 2

## 2011-11-18 MED ORDER — HEPARIN SODIUM (PORCINE) 5000 UNIT/ML IJ SOLN
5000.0000 [IU] | Freq: Three times a day (TID) | INTRAMUSCULAR | Status: DC
  Filled 2011-11-18 (×2): qty 1

## 2011-11-18 MED ORDER — ATORVASTATIN CALCIUM 20 MG PO TABS
20.0000 mg | ORAL_TABLET | Freq: Every day | ORAL | Status: DC
  Administered 2011-11-19: 20 mg via ORAL
  Filled 2011-11-18: qty 1

## 2011-11-18 MED ORDER — ASPIRIN EC 81 MG PO TBEC
81.0000 mg | DELAYED_RELEASE_TABLET | Freq: Every day | ORAL | Status: DC
  Administered 2011-11-19: 81 mg via ORAL
  Filled 2011-11-18: qty 1

## 2011-11-18 MED ORDER — ONDANSETRON HCL 4 MG/2ML IJ SOLN: 4.0000 mg | Freq: Four times a day (QID) | INTRAMUSCULAR | Status: DC | PRN

## 2011-11-18 MED ORDER — HYDROCHLOROTHIAZIDE 25 MG PO TABS
25.0000 mg | ORAL_TABLET | Freq: Once | ORAL | Status: AC
  Administered 2011-11-18: 25 mg via ORAL

## 2011-11-18 MED ORDER — SODIUM CHLORIDE 0.9 % IV SOLN: 250.0000 mL | INTRAVENOUS | Status: DC | PRN

## 2011-11-18 MED ORDER — SODIUM CHLORIDE 0.9 % IJ SOLN: 3.0000 mL | INTRAMUSCULAR | Status: DC | PRN

## 2011-11-18 MED ORDER — HYDROCHLOROTHIAZIDE 25 MG PO TABS
25.0000 mg | ORAL_TABLET | Freq: Every day | ORAL | Status: DC
  Administered 2011-11-19: 25 mg via ORAL
  Filled 2011-11-18 (×2): qty 1

## 2011-11-18 MED ORDER — ACETAMINOPHEN 325 MG PO TABS
650.0000 mg | ORAL_TABLET | Freq: Once | ORAL | Status: AC
  Administered 2011-11-18: 650 mg via ORAL
  Filled 2011-11-18: qty 2

## 2011-11-18 MED ORDER — LISINOPRIL 20 MG PO TABS
40.0000 mg | ORAL_TABLET | Freq: Once | ORAL | Status: AC
  Administered 2011-11-18: 40 mg via ORAL

## 2011-11-18 NOTE — ED Notes (Addendum)
MD at bedside. EDP Aubery Lapping

## 2011-11-18 NOTE — ED Notes (Addendum)
Pt reports severe head pain that started on Monday, reports having blurred vision/unable to see out of R eye; having dizziness; and HTN--has been off BP meds for 1.5 months d/t unable to afford; hypertensive at triage; grips equal, face symmetrical, speech clear, no drift; LSN was Monday; reports hx of CVA in march and states these are similar symptoms; reports his CVA was hemorrhagic

## 2011-11-18 NOTE — ED Notes (Signed)
Pt placed in gown, on cardiac monitor

## 2011-11-18 NOTE — ED Provider Notes (Signed)
History     CSN: [id number]  Arrival date & time 11/18/11  1709   First MD Initiated Contact with Patient 11/18/11 1819      Chief Complaint  Patient presents with  . Stroke Symptoms    (Consider location/radiation/quality/duration/timing/severity/associated sxs/prior treatment) Patient is a 46 y.o. male presenting with headaches and eye problem. The history is provided by the patient.  Headache  This is a new problem. The current episode started more than 2 days ago. The problem occurs constantly. The problem has not changed since onset.Pertinent negatives include no fever, no shortness of breath, no nausea and no vomiting.  Eye Problem  This is a recurrent problem. The current episode started more than 2 days ago. The problem occurs constantly. The problem has not changed since onset.There is pain in both eyes. Injury mechanism: headache, had been affected after previous bleed. The pain is severe. There is no history of trauma to the eye. There is no known exposure to pink eye. Associated symptoms include decreased vision. Pertinent negatives include no numbness, no blurred vision, no discharge, no double vision, no foreign body sensation, no photophobia, no eye redness, no nausea, no vomiting and no weakness. He has tried nothing for the symptoms. The treatment provided no relief.    Past Medical History  Diagnosis Date  . Hypertension   . CVA (cerebral vascular accident)   . Hyperlipidemia     Past Surgical History  Procedure Date  . Back surgery 2005    History reviewed. No pertinent family history.  History  Substance Use Topics  . Smoking status: Former Games developer  . Smokeless tobacco: Never Used   Comment: quit 18 years ago (documented in 2013)  . Alcohol Use: No      Review of Systems  Constitutional: Negative for fever.  Eyes: Negative for blurred vision, double vision, photophobia, discharge, redness and itching. Eye pain: behind left eye.  Respiratory:  Negative for cough and shortness of breath.   Cardiovascular: Negative for chest pain.  Gastrointestinal: Negative for nausea, vomiting, abdominal pain and diarrhea.  Neurological: Positive for headaches. Negative for weakness and numbness.  All other systems reviewed and are negative.    Allergies  Review of patient's allergies indicates no known allergies.  Home Medications   Current Outpatient Rx  Name Route Sig Dispense Refill  . ACETAMINOPHEN 325 MG PO TABS Oral Take 650 mg by mouth every 6 (six) hours as needed. For pain    . ASPIRIN 325 MG PO TABS Oral Take 325 mg by mouth every 4 (four) hours as needed.    . IBUPROFEN 200 MG PO TABS Oral Take 200 mg by mouth every 6 (six) hours as needed. For pain    . ATORVASTATIN CALCIUM 20 MG PO TABS Oral Take 20 mg by mouth daily at 6 PM.    . DIVALPROEX SODIUM ER 500 MG PO TB24 Oral Take 1,000 mg by mouth daily.    Marland Kitchen HYDROCHLOROTHIAZIDE 25 MG PO TABS Oral Take 25 mg by mouth daily.    Marland Kitchen LISINOPRIL 20 MG PO TABS Oral Take 40 mg by mouth 2 (two) times daily.    Marland Kitchen POTASSIUM CHLORIDE CRYS ER 20 MEQ PO TBCR Oral Take 20 mEq by mouth daily.      BP 192/105  Pulse 71  Temp 97.5 F (36.4 C) (Oral)  Resp 18  SpO2 98%  Physical Exam  Nursing note and vitals reviewed. Constitutional: He is oriented to person, place, and time. He appears  well-developed and well-nourished. No distress.  HENT:  Head: Normocephalic and atraumatic.  Eyes: Pupils are equal, round, and reactive to light. Right eye exhibits abnormal extraocular motion. Right eye exhibits no nystagmus. Left eye exhibits abnormal extraocular motion. Left eye exhibits no nystagmus.  Cardiovascular: Normal rate and normal heart sounds.   Pulmonary/Chest: Effort normal and breath sounds normal. No respiratory distress.  Abdominal: Soft. He exhibits no distension. There is no tenderness.  Musculoskeletal: Normal range of motion.  Neurological: He is alert and oriented to person, place,  and time.  Skin: Skin is warm and dry.  Psychiatric: He has a normal mood and affect.    ED Course  Procedures (including critical care time)  Labs Reviewed  CBC - Abnormal; Notable for the following:    WBC 11.8 (*)     All other components within normal limits  DIFFERENTIAL - Abnormal; Notable for the following:    Neutro Abs 8.0 (*)     All other components within normal limits  COMPREHENSIVE METABOLIC PANEL - Abnormal; Notable for the following:    GFR calc non Af Amer 86 (*)     All other components within normal limits  GLUCOSE, CAPILLARY - Abnormal; Notable for the following:    Glucose-Capillary 102 (*)     All other components within normal limits  PROTIME-INR  APTT  TROPONIN I   Ct Head (brain) Wo Contrast  11/18/2011  *RADIOLOGY REPORT*  Clinical Data: 46 year old male with stroke-like symptoms including dizziness slurred speech.  History of hypertension and intracranial hemorrhage.  CT HEAD WITHOUT CONTRAST  Technique:  Contiguous axial images were obtained from the base of the skull through the vertex without contrast.  Comparison: 05/19/2011 and earlier.  Findings: Mild right mastoid opacification is new.  Other paranasal sinuses and mastoids are stable. No acute osseous abnormality identified.  Visualized orbits and scalp soft tissues are within normal limits.  Interval expected evolution of the left PCA hemorrhage/hemorrhagic infarct in March.  There is now mild encephalomalacia in the left occipital lobe.  No ventriculomegaly. No midline shift, mass effect, or evidence of mass lesion.  No acute intracranial hemorrhage identified.  No evidence of cortically based acute infarction identified.  No suspicious intracranial vascular hyperdensity.  Normal gray-white matter differentiation outside of the left PCA territory.  IMPRESSION: 1. No acute intracranial abnormality. 2.  Interval expected evolution of left PCA hemorrhage/hemorrhagic infarct.   Original Report Authenticated  By: Harley Hallmark, M.D.      1. Hypertension, malignant       MDM  Pt presents with severe HTN (has not taken medicine for 1.5 months due to money issues) and symptoms similar to his last stroke which was hemorrhagic. 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 These symptoms have been ongoing since Monday, so patient is not a tPA candidate (plus recent bleed) so will not activate code stroke. Will get head CT and bring down blood pressure with labateolol. Pt denies CP or other signs of end-organ damage at this time but will still check labs.   8:15 PM CT head without acute change. Will admit patient to medicine for hypertensive urgency. Will treat headache with tylenol.      Daleen Bo, MD 11/18/11 [phone]

## 2011-11-18 NOTE — H&P (Signed)
Family Medicine Teaching [facility] Admission History and Physical Service Pager: [phone]  Patient name: Larry Padilla Medical record number: [id number] Date of birth: [birth year] Age: 46 y.o. Gender: male  Primary Care Provider: Burnett Kanaris, [place]  Chief Complaint: visual problems, HTN urgency   History of Present Illness: Larry Padilla is a 46 y.o. year old male presenting with visual problems for 1 week. Since Monday he's had pain behind his left eye, headache, and a swirly scotoma. Notably he had a hemmorhagic stroke in March of this year. He hasn't been taking his HTN meds (lisinopril, HCTZ) for 1.5 months because he can't afford to go to the doctor and get a new Rx. He checks his blood pressure at home and routinely has BPs of 230/120. States that at home he takes lisinopril and HCTZ and states that they were lower but not controlled. States that he has had decreased and blurry vision as well as decreased peripheral vision in his R eye for 2 months. This was residual from his stroke in March but did resolve and return 2 months ago.  His main reason for coming to the ED today was dizziness; he had been dealing with pressure behind his left eye and worsening blurry vision since 3 days ago.  His L eye pain is similar to the pain he felt at the time of his hemorrhagic stroke earlier this year.  He reports that his BP was completely normal until January of this year when it suddenly increased.  He currently has a headache, which has also been present x3 days, which seems to be worsening while in the ED.   Patient Active Problem List  Diagnosis  . Intracerebral hemorrhage  . Headache  . Hypertension, malignant  . Hyperlipidemia LDL goal < 100  . Obesity, Class II, BMI 35.0-39.9, with comorbidity (see actual BMI)  . Vision loss  . Anxiety   Past Medical History: Past Medical History  Diagnosis Date  . Hypertension   . CVA (cerebral vascular accident)   . Hyperlipidemia     Past Surgical History: Past Surgical History  Procedure Date  . Back surgery 2005   Social History: History  Substance Use Topics  . Smoking status: Former Games developer  . Smokeless tobacco: Never Used   Comment: quit 18 years ago (documented in 2013)  . Alcohol Use: No   For any additional social history documentation, please refer to relevant sections of EMR.  Family History: History reviewed. No pertinent family history. Allergies: No Known Allergies No current facility-administered medications on file prior to encounter.   Current Outpatient Prescriptions on File Prior to Encounter  Medication Sig Dispense Refill  . atorvastatin (LIPITOR) 20 MG tablet Take 20 mg by mouth daily at 6 PM.      . divalproex (DEPAKOTE ER) 500 MG 24 hr tablet Take 1,000 mg by mouth daily.      . hydrochlorothiazide (HYDRODIURIL) 25 MG tablet Take 25 mg by mouth daily.      Marland Kitchen lisinopril (PRINIVIL,ZESTRIL) 20 MG tablet Take 40 mg by mouth 2 (two) times daily.      . potassium chloride SA (K-DUR,KLOR-CON) 20 MEQ tablet Take 20 mEq by mouth daily.       Review Of Systems: Per HPI with the following, Otherwise 12 point review of systems was performed and was unremarkable.  Physical Exam: BP 194/115  Pulse 68  Temp 97.5 F (36.4 C) (Oral)  Resp 20  SpO2 98% Exam: Gen: NAD, alert, cooperative with exam, laying in  bed HEENT: NCAT, EOMI, PERRL, no papilledema BL, decreased peripheral vision on right side to confrontation CV: RRR, good s1/s2, no murmur Resp: CTABL, no wheezes, non-labored Abd: SNTND, BS present, no guarding or organomegaly Ext: No edema, 2+ pedal pulses Neuro: Alert and oriented, No gross deficits, CN 2-12 intact   Labs and Imaging: CBC BMET   Lab 11/18/11 1747  WBC 11.8*  HGB 15.7  HCT 45.3  PLT 275    Lab 11/18/11 1747  NA 140  K 3.5  CL 103  CO2 25  BUN 17  CREATININE 1.03  GLUCOSE 97  CALCIUM 10.4     Troponin negative times 1  PT: 12.9 INR 0.98  CT Head  11/18/2011 IMPRESSION: 1. No acute intracranial abnormality. 2. Interval expected evolution of left PCA hemorrhage/hemorrhagic Infarct.  EKG: NSR   Assessment and Plan: Larry Padilla is a 46 y.o. male presenting with hypertensive urgency.  1. Hypertensive urgency- no evidence of end organ damage as labs are WNL. Likely precipitated by non compliance with his medications.  1. Restart home lisinopril and HCTZ 2. PRN hydralizine with parameters (SBP > 180, DBP >110) 3. Troponin neg x1, cycle x1 additional to r/o cardiac damage  4. EKG and BMP in the AM 5. CT head negative for new hemorrhagic stroke, no focal neuro deficits that are new since previous stroke; consider MRI if new or worsening symptoms (such as worsening blurry vision, worsening HA)  2. HLD- patient has not been taking home lipitor.  1. Consider lipid panel outpatient (LDL elevated in 04/2011 when checked after stroke; pt non-compliant with meds so likely still elevated) 2. Restart home Lipitor   3. FEN/GI: low salt diet, saline lock IV fluids 4. Prophylaxis: SQ heparin  5. Disposition: Admit to tele, home pending BP control on oral agents 6. Code Status: Full Kevin Fenton, MD 11/18/2011, 8:30 PM   UPPER LEVEL ADDENDUM  I have seen and examined Larry Padilla with Dr. Ermalinda Memos and I agree with the above assessment/plan. I have reviewed all available data and have made any necessary changes to the above H&P.  Larry Padilla 11/18/2011, 9:37 PM

## 2011-11-18 NOTE — ED Provider Notes (Signed)
46 year old male with a history of hypertension and hemorrhagic stroke has had a left retro-orbital headache for the last 3 days which is getting worse. He has noted some difficulty with vision on lateral gaze. Exam, he is severely hypertensive. Fundi show no hemorrhage, exudate, or papilledema. He has limitation on full lateral gaze bilaterally with diplopia noted on end lateral gaze bilaterally. There no carotid bruits and there no other abnormal neurologic findings. He has not taken his blood pressure medicine in several months. He will be given labetalol to control blood pressure. Head CT has been ordered.  ECG shows normal sinus rhythm with a rate of 80, no ectopy. Normal axis. Normal P wave. Normal QRS. Normal intervals. Normal ST and T waves. Impression: normal ECG. No prior ECG available for comparison.  CRITICAL CARE Performed by: Dione Booze   Total critical care time: 45 minutes  Critical care time was exclusive of separately billable procedures and treating other patients.  Critical care was necessary to treat or prevent imminent or life-threatening deterioration.  Critical care was time spent personally by me on the following activities: development of treatment plan with patient and/or surrogate as well as nursing, discussions with consultants, evaluation of patient's response to treatment, examination of patient, obtaining history from patient or surrogate, ordering and performing treatments and interventions, ordering and review of laboratory studies, ordering and review of radiographic studies, pulse oximetry and re-evaluation of patient's condition.   I saw and evaluated the patient, reviewed the resident's note and I agree with the findings and plan.   Dione Booze, MD 11/19/11 [phone]

## 2011-11-18 NOTE — ED Notes (Signed)
Patient transported to and from CT.

## 2011-11-18 NOTE — ED Notes (Signed)
Chem 8 results: Na-142 k-3.5 Cl-105 ica-1.21 tco2-26 Glu-98 Bun-18 crea-1.1 hct-47 Hb-16.0 angap-16

## 2011-11-18 NOTE — ED Notes (Signed)
Pt reports a headache, "loss of vision" to (R) eye, and hot and cold sweats, and elevated BP."feels like my head is in a pressure cooker." pt reports stroke in March. Unable to afford his medications, last dose was 1 1/2 month ago. Pt sts "I'm having a pre stroke." pt denies chest pain, sob, or dizziness

## 2011-11-18 NOTE — ED Notes (Signed)
CBG was 102. Notified Nurse. 

## 2011-11-19 ENCOUNTER — Encounter (HOSPITAL_COMMUNITY): Payer: Self-pay | Admitting: Anesthesiology

## 2011-11-19 LAB — POCT I-STAT, CHEM 8
Calcium, Ion: 1.21 mmol/L (ref 1.12–1.23)
Chloride: 105 mEq/L (ref 96–112)
Creatinine, Ser: 1.1 mg/dL (ref 0.50–1.35)
Glucose, Bld: 98 mg/dL (ref 70–99)
HCT: 47 % (ref 39.0–52.0)

## 2011-11-19 LAB — RAPID URINE DRUG SCREEN, HOSP PERFORMED
Amphetamines: NOT DETECTED
Cocaine: NOT DETECTED
Opiates: POSITIVE — AB
Tetrahydrocannabinol: NOT DETECTED

## 2011-11-19 LAB — BASIC METABOLIC PANEL
Chloride: 101 mEq/L (ref 96–112)
GFR calc Af Amer: >90 mL/min (ref >90)
GFR calc non Af Amer: >90 mL/min (ref >90)
Potassium: 3 mEq/L — ABNORMAL LOW (ref 3.5–5.1)
Sodium: 138 mEq/L (ref 135–145)

## 2011-11-19 LAB — TROPONIN I: Troponin I: <0.3 ng/mL (ref <0.30)

## 2011-11-19 LAB — CBC
HCT: 41.9 % (ref 39.0–52.0)
Hemoglobin: 14.8 g/dL (ref 13.0–17.0)
WBC: 12.4 10*3/uL — ABNORMAL HIGH (ref 4.0–10.5)

## 2011-11-19 MED ORDER — MORPHINE SULFATE 2 MG/ML IJ SOLN: 2.0000 mg | INTRAMUSCULAR | Status: DC | PRN

## 2011-11-19 MED ORDER — KETOROLAC TROMETHAMINE 30 MG/ML IJ SOLN
30.0000 mg | Freq: Four times a day (QID) | INTRAMUSCULAR | Status: DC
  Administered 2011-11-19 (×2): 30 mg via INTRAVENOUS
  Filled 2011-11-19 (×5): qty 1

## 2011-11-19 MED ORDER — PRAVASTATIN SODIUM 20 MG PO TABS: 20.0000 mg | ORAL_TABLET | Freq: Every day | ORAL | Status: DC

## 2011-11-19 MED ORDER — HYDROCHLOROTHIAZIDE 25 MG PO TABS: 25.0000 mg | ORAL_TABLET | Freq: Every day | ORAL | Status: DC

## 2011-11-19 MED ORDER — POTASSIUM CHLORIDE CRYS ER 20 MEQ PO TBCR
EXTENDED_RELEASE_TABLET | ORAL | Status: AC
  Administered 2011-11-19: 20 meq via ORAL
  Filled 2011-11-19: qty 1

## 2011-11-19 MED ORDER — MORPHINE SULFATE 2 MG/ML IJ SOLN
1.0000 mg | INTRAMUSCULAR | Status: DC | PRN
  Administered 2011-11-19: 1 mg via INTRAVENOUS
  Filled 2011-11-19: qty 1

## 2011-11-19 MED ORDER — POTASSIUM CHLORIDE CRYS ER 20 MEQ PO TBCR
20.0000 meq | EXTENDED_RELEASE_TABLET | Freq: Two times a day (BID) | ORAL | Status: DC
  Administered 2011-11-19: 20 meq via ORAL

## 2011-11-19 MED ORDER — POTASSIUM CHLORIDE CRYS ER 20 MEQ PO TBCR: 20.0000 meq | EXTENDED_RELEASE_TABLET | Freq: Two times a day (BID) | ORAL | Status: DC

## 2011-11-19 MED ORDER — LISINOPRIL 40 MG PO TABS: 40.0000 mg | ORAL_TABLET | Freq: Every day | ORAL | Status: DC

## 2011-11-19 MED ORDER — ZOLPIDEM TARTRATE 5 MG PO TABS
5.0000 mg | ORAL_TABLET | Freq: Every evening | ORAL | Status: DC | PRN
  Administered 2011-11-19: 5 mg via ORAL
  Filled 2011-11-19: qty 1

## 2011-11-19 NOTE — Progress Notes (Signed)
I discussed with Dr Sonnenberg.  I agree with their plans documented in their progress note for today.  

## 2011-11-19 NOTE — Care Management Note (Signed)
    Page 1 of 1   11/19/2011     3:12:58 PM   CARE MANAGEMENT NOTE 11/19/2011  Patient:  Larry Padilla,Larry Padilla   Account Number:  [id number]  Date Initiated:  11/19/2011  Documentation initiated by:  Donn Pierini  Subjective/Objective Assessment:   Pt admitted with HTN urgency     Action/Plan:   PTA pt lived at home, independent   Anticipated DC Date:  11/19/2011   Anticipated DC Plan:  HOME/SELF CARE      DC Planning Services  CM consult      Choice offered to / List presented to:             Status of service:  Completed, signed off Medicare Important Message given?   (If response is "NO", the following Medicare IM given date fields will be blank) Date Medicare IM given:   Date Additional Medicare IM given:    Discharge Disposition:  HOME/SELF CARE  Per UR Regulation:  Reviewed for med. necessity/level of care/duration of stay  If discussed at Long Length of Stay Meetings, dates discussed:    Comments:  11/19/11- 1455- Donn Pierini RN, BSN [phone] Spoke with pt at bedside - per conversation pt states that he had a PCP- Dr. Piedad Climes but was unable to afford going to doctor to get prescription refills- he states that most of his meds are on the $4 list at [facility] he goes to get his medicines. Pt says that he is unable to get his meds at discharge (has no money until next week) and is asking for assistance- called main pharmacy to check eligibility and pt was assisted in March of this year and therefore is not eligible for assistance at this time- pt informed that he is not eligible- RN also aware- pt will be given scripts for medications. Per primary MD team- pt has been set up for outpt follow up with the outpt IM clinic.

## 2011-11-19 NOTE — Progress Notes (Signed)
S: patient has reached floor and is complaining of headache and blurrier vision in his right eye. He has received 2 tablets of Norco at 2140 and said it did not take away the pain, but just made him "loopy." He is requesting something else for pain and a sleep aide. He states he has been unable to sleep for days and is under much anxiety.  Patient is also refusing medications by IM, such as heparin or Lovenox DVT prophylaxis.   O: Current pressure: 217/124  CT scan showed no acute abnormality.   A/P:  - Ambien 5 mg QHS PRN - Morphine 1mg  now x1 - SCD's - HTN urgency: Goal of MAP reduction by 25% over 4 hours. Current MAP 155--> goal 116 in four hours. Ideally, BP no lower than 170/90 in first 4 hours.

## 2011-11-19 NOTE — Progress Notes (Signed)
He says he's had an "ink spot" in his vision for months. He'll need opthalmology evaluation when it can be arranged. He's planning on getting insurance under the [employer], but that won't start until Jan 1.    Kenetha Cozza A. Sheffield Slider, MD Family Medicine Teaching Service Attending  11/19/2011 8:18 AM

## 2011-11-19 NOTE — Discharge Summary (Signed)
I discussed with Dr Sonnenberg.  I agree with their plans documented in their progress note for today.  

## 2011-11-19 NOTE — Progress Notes (Signed)
Patient ID: Larry Padilla, male   DOB: June 17, 1965, 46 y.o.   MRN: [id number] Family Medicine Teaching Service Daily Progress Note Service Page: [phone]  Patient Assessment: 46 yo male presented with HTN urgency into the 190's/120's with recent history of medication non-compliance  Subjective: Patient states feeling a little bit better this morning. HA is still there but better. Stated right eye hurt upon EOM.  Feels tired. States vision is improved and that the film cleared, but still has scotoma.  Objective: Temp:  [97.5 F (36.4 C)-98.1 F (36.7 C)] 98 F (36.7 C) (09/27 0500) Pulse Rate:  [68-99] 87  (09/27 0500) Resp:  [14-21] 20  (09/27 0136) BP: (146-217)/(79-129) 146/79 mmHg (09/27 0819) SpO2:  [96 %-100 %] 98 % (09/27 0500) Weight:  [319 lb 14.2 oz (145.1 kg)] 319 lb 14.2 oz (145.1 kg) (09/27 0000) Exam: General: NAD, resting comfortably in bed Cardiovascular: rrr, no murmurs rubs or gallops Respiratory: CTAB anteriorly, no wheezes Abdomen:  Soft, NT, ND Extremities: no edema Neuro: CN 3-12 intact, CN 2 grossly intact  I have reviewed the patient's medications, labs, imaging, and diagnostic testing.  Notable results are summarized below.  CBC BMET   Lab 11/19/11 0254 11/18/11 1747  WBC 12.4* 11.8*  HGB 14.8 15.7  HCT 41.9 45.3  PLT 286 275    Lab 11/19/11 0254 11/18/11 1747  NA 138 140  K 3.0* 3.5  CL 101 103  CO2 23 25  BUN 14 17  CREATININE 0.89 1.03  GLUCOSE 123* 97  CALCIUM 9.7 10.4     Imaging/Diagnostic Tests: No new imaging  Plan: Larry Padilla is a 46 y.o. male presenting with hypertensive urgency.  1. Hypertensive emergency- with increased blurry vision and headache. Likely precipitated by non compliance with his medications.  1. Restart home lisinopril and HCTZ 2. PRN hydralizine with parameters (SBP > 180, DBP >110)-got 3 doses of this last night 3. Troponin neg x1, patient refused am troponin due to fear of needles  4. K 3.0 repleted  today 5. CT head negative for new hemorrhagic stroke, no focal neuro deficits that are new since previous stroke; consider MRI if new or worsening symptoms (such as worsening blurry vision, worsening HA) 2. HLD- patient has not been taking home lipitor.  1. Consider lipid panel outpatient (LDL elevated in 04/2011 when checked after stroke; pt non-compliant with meds so likely still elevated) 2. Restart home Lipitor  3. FEN/GI: low salt diet, saline lock IV fluids, hypokalemia will be repleted with KCl 20 mEq x4 doses 4. Prophylaxis: SQ heparin  5. Disposition: Admit to tele, home pending BP control on oral agents 6. Code Status: Full  Marikay Alar, MD 11/19/2011, 9:21 AM

## 2011-11-19 NOTE — Progress Notes (Signed)
Utilization review completed.  

## 2011-11-19 NOTE — Discharge Summary (Signed)
Physician Discharge Summary  Patient ID: Larry Padilla MRN: [id number] DOB: 1965/11/07 Age: 47 y.o.  Admit date: 11/18/2011 Discharge date: 11/19/2011 Admitting Physician: Tobin Chad, MD  PCP: Burnett Kanaris, [place]  Consultants:none     Discharge Diagnosis: Hypertensive Emergency Principal Problem:  *Hypertension, malignant Active Problems:  Headache  Obesity, Class II, BMI 35.0-39.9, with comorbidity (see actual BMI)    Hospital Course Larry Padilla is a 46 y.o. male presenting with hypertensive urgency.  1. Hypertensive emergency- with increased blurry vision and headache. Likely precipitated by non compliance with his medications.  Over night his BP was elevated to the 200/120 and complained of worsening HA and left ey pain.  Was given 2 doses of morphine at this time with resolution of HA. At time of discharge headache was improved and vision changes had resolved.  Blood pressure down to 163/77 at time of discharge. Restarted home lisinopril and HCTZ. PRN hydralizine with parameters (SBP > 180, DBP >110)-got 3 doses of this last night. Troponin neg x1, patient refused am troponin due to fear of needles. K 3.0 repleted today. CT head negative for new hemorrhagic stroke, no focal neuro deficits that are new since previous stroke. 2. HLD- patient has not been taking home lipitor. Was restarted on home lipitor in hospital. 3. FEN/GI: low salt diet, saline lock IV fluids, hypokalemia will be repleted with KCl 20 mEq x4 doses 4. Headache: this resolved with BP treatment, 2 doses of morphine, and IV toradol   Problem List 1. Hypertensive emergency 2. HTN 3. Headache 4. Obesity  5. HLD        Discharge PE   Filed Vitals:   11/19/11 0955  BP: 163/77  Pulse:   Temp:   Resp:    General: NAD, resting comfortably in bed  Cardiovascular: rrr, no murmurs rubs or gallops  Respiratory: CTAB anteriorly, no wheezes  Abdomen: Soft, NT, ND  Extremities: no edema    Neuro: CN 3-12 intact, CN 2 grossly intact    Procedures/Imaging:  Ct Head (brain) Wo Contrast  11/18/2011  IMPRESSION: 1. No acute intracranial abnormality. 2.  Interval expected evolution of left PCA hemorrhage/hemorrhagic infarct.   Original Report Authenticated By: Harley Hallmark, M.D.    Labs  CBC  Lab 11/19/11 0254 11/18/11 1802 11/18/11 1747  WBC 12.4* -- 11.8*  HGB 14.8 16.0 15.7  HCT 41.9 47.0 45.3  PLT 286 -- 275   BMET  Lab 11/19/11 0254 11/18/11 1802 11/18/11 1747  NA 138 142 140  K 3.0* 3.5 3.5  CL 101 105 103  CO2 23 -- 25  BUN 14 18 17   CREATININE 0.89 1.10 1.03  CALCIUM 9.7 -- 10.4  PROT -- -- 8.0  BILITOT -- -- 0.3  ALKPHOS -- -- 80  ALT -- -- 21  AST -- -- 30  GLUCOSE 123* 98 97   Results for orders placed during the hospital encounter of 11/18/11 (from the past 72 hour(s))  PROTIME-INR     Status: Normal   Collection Time   11/18/11  5:47 PM      Component Value Range Comment   Prothrombin Time 12.9  11.6 - 15.2 seconds    INR 0.98  0.00 - 1.49   APTT     Status: Normal   Collection Time   11/18/11  5:47 PM      Component Value Range Comment   aPTT 33  24 - 37 seconds   CBC     Status: Abnormal  Collection Time   11/18/11  5:47 PM      Component Value Range Comment   WBC 11.8 (*) 4.0 - 10.5 K/uL    RBC 5.22  4.22 - 5.81 MIL/uL    Hemoglobin 15.7  13.0 - 17.0 g/dL    HCT 96.0  45.4 - 09.8 %    MCV 86.8  78.0 - 100.0 fL    MCH 30.1  26.0 - 34.0 pg    MCHC 34.7  30.0 - 36.0 g/dL    RDW 11.9  14.7 - 82.9 %    Platelets 275  150 - 400 K/uL   DIFFERENTIAL     Status: Abnormal   Collection Time   11/18/11  5:47 PM      Component Value Range Comment   Neutrophils Relative 67  43 - 77 %    Neutro Abs 8.0 (*) 1.7 - 7.7 K/uL    Lymphocytes Relative 23  12 - 46 %    Lymphs Abs 2.7  0.7 - 4.0 K/uL    Monocytes Relative 9  3 - 12 %    Monocytes Absolute 1.0  0.1 - 1.0 K/uL    Eosinophils Relative 1  0 - 5 %    Eosinophils Absolute 0.1  0.0 - 0.7  K/uL    Basophils Relative 0  0 - 1 %    Basophils Absolute 0.0  0.0 - 0.1 K/uL   COMPREHENSIVE METABOLIC PANEL     Status: Abnormal   Collection Time   11/18/11  5:47 PM      Component Value Range Comment   Sodium 140  135 - 145 mEq/L    Potassium 3.5  3.5 - 5.1 mEq/L    Chloride 103  96 - 112 mEq/L    CO2 25  19 - 32 mEq/L    Glucose, Bld 97  70 - 99 mg/dL    BUN 17  6 - 23 mg/dL    Creatinine, Ser 5.62  0.50 - 1.35 mg/dL    Calcium 13.0  8.4 - 10.5 mg/dL    Total Protein 8.0  6.0 - 8.3 g/dL    Albumin 4.1  3.5 - 5.2 g/dL    AST 30  0 - 37 U/L    ALT 21  0 - 53 U/L    Alkaline Phosphatase 80  39 - 117 U/L    Total Bilirubin 0.3  0.3 - 1.2 mg/dL    GFR calc non Af Amer 86 (*) >90 mL/min    GFR calc Af Amer >90  >90 mL/min   TROPONIN I     Status: Normal   Collection Time   11/18/11  5:48 PM      Component Value Range Comment   Troponin I <0.30  <0.30 ng/mL   GLUCOSE, CAPILLARY     Status: Abnormal   Collection Time   11/18/11  5:51 PM      Component Value Range Comment   Glucose-Capillary 102 (*) 70 - 99 mg/dL    Comment 1 Documented in Chart      Comment 2 Notify RN     POCT I-STAT, CHEM 8     Status: Normal   Collection Time   11/18/11  6:02 PM      Component Value Range Comment   Sodium 142  135 - 145 mEq/L    Potassium 3.5  3.5 - 5.1 mEq/L    Chloride 105  96 - 112 mEq/L    BUN 18  6 - 23 mg/dL    Creatinine, Ser 1.61  0.50 - 1.35 mg/dL    Glucose, Bld 98  70 - 99 mg/dL    Calcium, Ion 0.96  0.45 - 1.23 mmol/L    TCO2 26  0 - 100 mmol/L    Hemoglobin 16.0  13.0 - 17.0 g/dL    HCT 40.9  81.1 - 91.4 %   BASIC METABOLIC PANEL     Status: Abnormal   Collection Time   11/19/11  2:54 AM      Component Value Range Comment   Sodium 138  135 - 145 mEq/L    Potassium 3.0 (*) 3.5 - 5.1 mEq/L    Chloride 101  96 - 112 mEq/L    CO2 23  19 - 32 mEq/L    Glucose, Bld 123 (*) 70 - 99 mg/dL    BUN 14  6 - 23 mg/dL    Creatinine, Ser 7.82  0.50 - 1.35 mg/dL    Calcium 9.7   8.4 - 10.5 mg/dL    GFR calc non Af Amer >90  >90 mL/min    GFR calc Af Amer >90  >90 mL/min   CBC     Status: Abnormal   Collection Time   11/19/11  2:54 AM      Component Value Range Comment   WBC 12.4 (*) 4.0 - 10.5 K/uL    RBC 4.91  4.22 - 5.81 MIL/uL    Hemoglobin 14.8  13.0 - 17.0 g/dL    HCT 95.6  21.3 - 08.6 %    MCV 85.3  78.0 - 100.0 fL    MCH 30.1  26.0 - 34.0 pg    MCHC 35.3  30.0 - 36.0 g/dL    RDW 57.8  46.9 - 62.9 %    Platelets 286  150 - 400 K/uL   TROPONIN I     Status: Normal   Collection Time   11/19/11  2:56 AM      Component Value Range Comment   Troponin I <0.30  <0.30 ng/mL   URINE RAPID DRUG SCREEN (HOSP PERFORMED)     Status: Abnormal   Collection Time   11/19/11  3:52 AM      Component Value Range Comment   Opiates POSITIVE (*) NONE DETECTED    Cocaine NONE DETECTED  NONE DETECTED    Benzodiazepines NONE DETECTED  NONE DETECTED    Amphetamines NONE DETECTED  NONE DETECTED    Tetrahydrocannabinol NONE DETECTED  NONE DETECTED    Barbiturates NONE DETECTED  NONE DETECTED        Patient condition at time of discharge/disposition: stable  Disposition-home   Follow up issues: 1. Blood pressure management, patient has had difficulty getting BP medications in the past 6 months due to inability to afford doctors appointments.  Was restarted on home medications in hospital.  May need further medication titration. 2. Will need to apply for orange card once he has first outpatient visit. 3. HLD: has not been taking statin at home due to cost. Was restarted on statin at time of discharge. May be worth checking lipid panel again.  Discharge follow up:  Follow-up Information    Follow up with Rodman Pickle, MD. On 11/29/2011. (at 2:30 pm)    Contact information:   [address] [place] [address] [phone]          Discharge Instructions: Please refer to Patient Instructions section of EMR for full details.  Patient  was counseled important  signs and symptoms that should prompt return to medical care, changes in medications, dietary instructions, activity restrictions, and follow up appointments.  Significant instructions noted below:  Discharge Orders    Future Appointments: Provider: Department: Dept Phone: Center:   11/29/2011 2:30 PM Amber Nydia Bouton, MD [facility] [phone] [facility]     Discharge Medications   Medication List     As of 11/19/2011  3:05 PM    START taking these medications         pravastatin 20 MG tablet   Commonly known as: PRAVACHOL   Take 1 tablet (20 mg total) by mouth daily.      CHANGE how you take these medications         lisinopril 40 MG tablet   Commonly known as: PRINIVIL,ZESTRIL   Take 1 tablet (40 mg total) by mouth daily.   What changed: - medication strength - how often to take the med      CONTINUE taking these medications         acetaminophen 325 MG tablet   Commonly known as: TYLENOL      aspirin 325 MG tablet      divalproex 500 MG 24 hr tablet   Commonly known as: DEPAKOTE ER      hydrochlorothiazide 25 MG tablet   Commonly known as: HYDRODIURIL   Take 1 tablet (25 mg total) by mouth daily.      ibuprofen 200 MG tablet   Commonly known as: ADVIL,MOTRIN      potassium chloride SA 20 MEQ tablet   Commonly known as: K-DUR,KLOR-CON      STOP taking these medications         atorvastatin 20 MG tablet   Commonly known as: LIPITOR          Where to get your medications    These are the prescriptions that you need to pick up. We sent them to a specific pharmacy, so you will need to go there to get them.   [id number] [place], [place] - [address]    [address] [place] [address]    Phone: [phone]        hydrochlorothiazide 25 MG tablet   lisinopril 40 MG tablet   pravastatin 20 MG tablet           Marikay Alar, MD of [name] 11/19/2011 2:26 PM

## 2011-11-19 NOTE — Progress Notes (Signed)
D/c orders received;IV removed with gauze on; pt remains instable condition, pt meds and nstructions reviewed and given to pt; pt d/c to home, prescriptions for meds sent home with pt; stressed to pt importance of taking meds and going to all /fu appts

## 2011-11-19 NOTE — H&P (Signed)
I interviewed and examined this patient and discussed the care plan with Dr. Fara Boros and the [facility] team and agree with assessment and plan as documented in the admission note for today. He reports having shaking chills last evening after feeling chilly for days. This morning his right 5th toe is numb but not painful. He reports that Dr Pearlean Brownie had emphasized the need to reduce stress in his life when he last had stroke symptoms.     Ayame Rena A. Sheffield Slider, MD Family Medicine Teaching Service Attending  11/19/2011 8:14 AM

## 2011-11-19 NOTE — Progress Notes (Signed)
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

## 2011-11-29 ENCOUNTER — Ambulatory Visit (INDEPENDENT_AMBULATORY_CARE_PROVIDER_SITE_OTHER): Payer: Self-pay | Admitting: Family Medicine

## 2011-11-29 ENCOUNTER — Encounter: Payer: Self-pay | Admitting: Family Medicine

## 2011-11-29 VITALS — BP 167/92 | HR 69 | Temp 98.1°F | Ht 75.0 in | Wt 318.0 lb

## 2011-11-29 DIAGNOSIS — I10 Essential (primary) hypertension: Secondary | ICD-10-CM

## 2011-11-29 DIAGNOSIS — H547 Unspecified visual loss: Secondary | ICD-10-CM

## 2011-11-29 DIAGNOSIS — E785 Hyperlipidemia, unspecified: Secondary | ICD-10-CM

## 2011-11-29 MED ORDER — AMLODIPINE BESYLATE 5 MG PO TABS: 5.0000 mg | ORAL_TABLET | Freq: Every day | ORAL | Status: DC

## 2011-11-29 MED ORDER — LISINOPRIL-HYDROCHLOROTHIAZIDE 20-25 MG PO TABS: 1.0000 | ORAL_TABLET | Freq: Every day | ORAL | Status: DC

## 2011-11-29 NOTE — Assessment & Plan Note (Signed)
Continue pravastatin qhs. Recheck labs in 3 months.

## 2011-11-29 NOTE — Assessment & Plan Note (Signed)
Vision still remains a problem and is worsening. Encourage patient to see eye doctor for full exam.

## 2011-11-29 NOTE — Assessment & Plan Note (Signed)
Patient's blood pressure remains high. Will make a few changes. Will ask him to take his BP medications at night so to avoid the high peak in the early morning. Also, will all Norvasc 5mg  to the regimen. (Need to avoid BB due to reported ED.) Patient is concerned why he is still having high blood pressures. i do not have a clear reason at this point. Encouraged him to take is day by day with diet, exercise, and medication compliance to get his BP down. Could possibly benefit from ambulatory BP monitoring.

## 2011-11-29 NOTE — Patient Instructions (Addendum)
It was nice to meet you.  Please start taking Norvasc in addition to the Lisinopril and HCTZ. Please make an appointment to come back and see me in 4-6 weeks.  Durward Matranga M. Anjelina Dung, M.D.
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
## 2012-01-11 ENCOUNTER — Ambulatory Visit: Payer: Self-pay | Admitting: Family Medicine

## 2012-01-31 ENCOUNTER — Ambulatory Visit: Payer: Self-pay | Admitting: Family Medicine

## 2012-05-08 ENCOUNTER — Other Ambulatory Visit: Payer: Self-pay | Admitting: *Deleted

## 2012-05-08 MED ORDER — LISINOPRIL-HYDROCHLOROTHIAZIDE 20-25 MG PO TABS: 1.0000 | ORAL_TABLET | Freq: Every day | ORAL | Status: DC

## 2012-05-08 NOTE — Telephone Encounter (Signed)
Needs office visit.  Larry Padilla, M.D.

## 2012-05-21 ENCOUNTER — Emergency Department (HOSPITAL_COMMUNITY)
Admission: EM | Admit: 2012-05-21 | Discharge: 2012-05-21 | Disposition: A | Discharge status: Home / Self Care | Payer: Self-pay | Attending: Emergency Medicine | Admitting: Emergency Medicine

## 2012-05-21 ENCOUNTER — Emergency Department (HOSPITAL_COMMUNITY): Payer: Self-pay

## 2012-05-21 DIAGNOSIS — W260XXA Contact with knife, initial encounter: Secondary | ICD-10-CM | POA: Insufficient documentation

## 2012-05-21 DIAGNOSIS — Z8639 Personal history of other endocrine, nutritional and metabolic disease: Secondary | ICD-10-CM | POA: Insufficient documentation

## 2012-05-21 DIAGNOSIS — Y9389 Activity, other specified: Secondary | ICD-10-CM | POA: Insufficient documentation

## 2012-05-21 DIAGNOSIS — Z8673 Personal history of transient ischemic attack (TIA), and cerebral infarction without residual deficits: Secondary | ICD-10-CM | POA: Insufficient documentation

## 2012-05-21 DIAGNOSIS — Z862 Personal history of diseases of the blood and blood-forming organs and certain disorders involving the immune mechanism: Secondary | ICD-10-CM | POA: Insufficient documentation

## 2012-05-21 DIAGNOSIS — Y92009 Unspecified place in unspecified non-institutional (private) residence as the place of occurrence of the external cause: Secondary | ICD-10-CM | POA: Insufficient documentation

## 2012-05-21 DIAGNOSIS — I10 Essential (primary) hypertension: Secondary | ICD-10-CM | POA: Insufficient documentation

## 2012-05-21 DIAGNOSIS — Z87891 Personal history of nicotine dependence: Secondary | ICD-10-CM | POA: Insufficient documentation

## 2012-05-21 DIAGNOSIS — S61209A Unspecified open wound of unspecified finger without damage to nail, initial encounter: Secondary | ICD-10-CM | POA: Insufficient documentation

## 2012-05-21 DIAGNOSIS — S61012A Laceration without foreign body of left thumb without damage to nail, initial encounter: Secondary | ICD-10-CM

## 2012-05-21 DIAGNOSIS — W261XXA Contact with sword or dagger, initial encounter: Secondary | ICD-10-CM | POA: Insufficient documentation

## 2012-05-21 MED ORDER — CEPHALEXIN 500 MG PO CAPS: 500.0000 mg | ORAL_CAPSULE | Freq: Four times a day (QID) | ORAL | Status: DC

## 2012-05-21 MED ORDER — HYDROCODONE-ACETAMINOPHEN 5-325 MG PO TABS
2.0000 | ORAL_TABLET | Freq: Once | ORAL | Status: AC
  Administered 2012-05-21: 2 via ORAL
  Filled 2012-05-21: qty 2

## 2012-05-21 MED ORDER — CEPHALEXIN 250 MG PO CAPS
250.0000 mg | ORAL_CAPSULE | Freq: Once | ORAL | Status: AC
  Administered 2012-05-21: 250 mg via ORAL
  Filled 2012-05-21: qty 1

## 2012-05-21 MED ORDER — HYDROCODONE-ACETAMINOPHEN 5-325 MG PO TABS: 1.0000 | ORAL_TABLET | Freq: Four times a day (QID) | ORAL | Status: AC | PRN

## 2012-05-21 NOTE — ED Notes (Signed)
Pt. Was at home and sliced the end of his thumb off with a butcher knife.  Pt sliced this off last evening, went home and came back to work and continue bump it and it has been bleeding since.

## 2012-05-21 NOTE — ED Provider Notes (Addendum)
History     CSN: [id number]  Arrival date & time 05/21/12  2024   First MD Initiated Contact with Patient 05/21/12 2046      Chief Complaint  Patient presents with  . Extremity Pain    (Consider location/radiation/quality/duration/timing/severity/associated sxs/prior treatment) HPI Comments: Pt comes in with cc of thumb injury. Pt has no significant medical hx, and is UTD with his immunizations (tetanus, last year). Pt reports that he was slicing food yday, and cut his thumb accidentally. The injury occurred at 6:30 pm last night. He put a band aid on, and more dressing and went home. He went to work today, and noticed that the bleeding restarted. The pain also went up, so he decided to come to the ER.   Patient is a 47 y.o. male presenting with extremity pain. The history is provided by the patient.  Extremity Pain Pertinent negatives include no chest pain and no abdominal pain.    Past Medical History  Diagnosis Date  . Hypertension   . CVA (cerebral vascular accident) feb 2013  . Hyperlipidemia     Past Surgical History  Procedure Laterality Date  . Back surgery  2005    No family history on file.  History  Substance Use Topics  . Smoking status: Former Games developer  . Smokeless tobacco: Never Used     Comment: quit 18 years ago (documented in 2013)  . Alcohol Use: Yes      Review of Systems  Constitutional: Negative for fever and activity change.  Cardiovascular: Negative for chest pain.  Gastrointestinal: Negative for nausea, vomiting and abdominal pain.  Genitourinary: Negative for dysuria.  Skin: Positive for wound.  Hematological: Does not bruise/bleed easily.    Allergies  Review of patient's allergies indicates no known allergies.  Home Medications   Current Outpatient Rx  Name  Route  Sig  Dispense  Refill  . lisinopril-hydrochlorothiazide (PRINZIDE,ZESTORETIC) 20-25 MG per tablet   Oral   Take 1 tablet by mouth daily.   30 tablet   0    Please have patient make office visit for addition ...     BP 168/104  Pulse 88  Temp(Src) 97.9 F (36.6 C) (Oral)  Resp 20  SpO2 100%  Physical Exam  Nursing note and vitals reviewed. Constitutional: He is oriented to person, place, and time. He appears well-developed.  HENT:  Head: Normocephalic and atraumatic.  Eyes: Conjunctivae and EOM are normal. Pupils are equal, round, and reactive to light.  Neck: Normal range of motion. Neck supple.  Cardiovascular: Normal rate and regular rhythm.   Pulmonary/Chest: Effort normal and breath sounds normal.  Abdominal: Soft. Bowel sounds are normal. He exhibits no distension. There is no tenderness. There is no rebound and no guarding.  Neurological: He is alert and oriented to person, place, and time.  Skin: Skin is warm.  Pt has sliced off, about 1-2 cm diameter area of skin in the dorsal aspect of the left thumb. There is 1 area that is actively oozing out. Neurovascularly intact.     ED Course  Procedures (including critical care time)  Labs Reviewed - No data to display No results found.   No diagnosis found.    MDM  Pt comes in with a cut from a knife from > 25 hours ago. Xray ordered to ensure there is no fracture/foreign body. Will numb the thumb, and cleanse the wound thoroughly. Will start pt on AB. Will stop the bleed.  Derwood Kaplan, MD 05/21/12 2124  10:54 PM The bleed ceased with a pro thrombotic dressing.   Derwood Kaplan, MD 05/21/12 2255

## 2012-05-24 ENCOUNTER — Telehealth: Payer: Self-pay | Admitting: Family Medicine

## 2012-05-24 MED ORDER — LISINOPRIL-HYDROCHLOROTHIAZIDE 20-25 MG PO TABS: 1.0000 | ORAL_TABLET | Freq: Every day | ORAL | Status: DC

## 2012-05-24 NOTE — Telephone Encounter (Signed)
Mr. Lograsso have an appt sched for 5/8 for his physical.  Requesting a refill on his hctz for 30 day supply until visit.  Can send to [facility] on [address]  Any questions please feel free to call.

## 2012-05-25 ENCOUNTER — Ambulatory Visit: Payer: Self-pay | Admitting: Family Medicine

## 2012-06-29 ENCOUNTER — Encounter: Payer: Self-pay | Admitting: Family Medicine

## 2012-07-13 ENCOUNTER — Encounter: Payer: Self-pay | Admitting: Family Medicine

## 2012-07-19 ENCOUNTER — Other Ambulatory Visit: Payer: Self-pay | Admitting: Family Medicine

## 2012-07-19 ENCOUNTER — Telehealth: Payer: Self-pay | Admitting: Family Medicine

## 2012-07-19 MED ORDER — LISINOPRIL-HYDROCHLOROTHIAZIDE 20-25 MG PO TABS: 1.0000 | ORAL_TABLET | Freq: Every day | ORAL | Status: AC

## 2012-07-19 MED ORDER — LISINOPRIL-HYDROCHLOROTHIAZIDE 20-25 MG PO TABS: 1.0000 | ORAL_TABLET | Freq: Every day | ORAL | Status: DC

## 2012-07-19 NOTE — Telephone Encounter (Signed)
Pt is out of his lisinopril-hctz and his appt is not until 6/12.  Needs to know if he can have enough until appt.  [facility] [address] - [employer]

## 2012-07-19 NOTE — Telephone Encounter (Signed)
One month sent to [facility]. Must keep appointment in June.  Thank you, Shellsea Borunda M. Deltha Bernales, M.D.

## 2012-07-19 NOTE — Progress Notes (Signed)
Patient called after hours line. Refill was sent to wrong pharmacy earlier today. Now refilled to [facility] in [place]. Pt appreciative.  Robson Trickey M. Naylin Burkle, M.D.

## 2012-07-19 NOTE — Telephone Encounter (Signed)
Pt notified and reminded must keep appt in June.  Callie Bunyard, Darlyne Russian, CMA

## 2012-07-19 NOTE — Telephone Encounter (Signed)
Will fwd to MD.  Reta Norgren L, CMA  

## 2012-07-25 ENCOUNTER — Encounter: Payer: Self-pay | Admitting: Family Medicine

## 2012-08-03 ENCOUNTER — Encounter: Payer: Self-pay | Admitting: Family Medicine

## 2012-08-15 ENCOUNTER — Encounter: Payer: Self-pay | Admitting: Family Medicine

## 2016-03-09 ENCOUNTER — Encounter: Payer: Self-pay | Admitting: Emergency Medicine

## 2016-03-09 ENCOUNTER — Emergency Department: Payer: Self-pay

## 2016-03-09 ENCOUNTER — Emergency Department
Admission: EM | Admit: 2016-03-09 | Discharge: 2016-03-09 | Disposition: A | Discharge status: Home / Self Care | Payer: Self-pay | Attending: Student in an Organized Health Care Education/Training Program | Admitting: Student in an Organized Health Care Education/Training Program

## 2016-03-09 DIAGNOSIS — Z87891 Personal history of nicotine dependence: Secondary | ICD-10-CM | POA: Insufficient documentation

## 2016-03-09 DIAGNOSIS — R413 Other amnesia: Secondary | ICD-10-CM | POA: Insufficient documentation

## 2016-03-09 DIAGNOSIS — Z79899 Other long term (current) drug therapy: Secondary | ICD-10-CM | POA: Insufficient documentation

## 2016-03-09 DIAGNOSIS — I159 Secondary hypertension, unspecified: Secondary | ICD-10-CM | POA: Insufficient documentation

## 2016-03-09 DIAGNOSIS — Z5181 Encounter for therapeutic drug level monitoring: Secondary | ICD-10-CM | POA: Insufficient documentation

## 2016-03-09 DIAGNOSIS — R42 Dizziness and giddiness: Secondary | ICD-10-CM

## 2016-03-09 LAB — COMPREHENSIVE METABOLIC PANEL
ALK PHOS: 69 U/L (ref 38–126)
ALT: 51 U/L (ref 17–63)
ANION GAP: 5 (ref 5–15)
AST: 46 U/L — ABNORMAL HIGH (ref 15–41)
Albumin: 4.5 g/dL (ref 3.5–5.0)
BUN: 16 mg/dL (ref 6–20)
CALCIUM: 9.8 mg/dL (ref 8.9–10.3)
CHLORIDE: 105 mmol/L (ref 101–111)
CO2: 28 mmol/L (ref 22–32)
Creatinine, Ser: 1.07 mg/dL (ref 0.61–1.24)
GFR calc Af Amer: >60 mL/min (ref >60)
GFR calc non Af Amer: >60 mL/min (ref >60)
GLUCOSE: 136 mg/dL — AB (ref 65–99)
Potassium: 4.9 mmol/L (ref 3.5–5.1)
SODIUM: 138 mmol/L (ref 135–145)
Total Bilirubin: 0.2 mg/dL — ABNORMAL LOW (ref 0.3–1.2)
Total Protein: 8.2 g/dL — ABNORMAL HIGH (ref 6.5–8.1)

## 2016-03-09 LAB — DIFFERENTIAL
BASOS PCT: 1 %
Basophils Absolute: 0.1 10*3/uL (ref 0–0.1)
EOS PCT: 1 %
Eosinophils Absolute: 0.2 10*3/uL (ref 0–0.7)
LYMPHS PCT: 22 %
Lymphs Abs: 2.9 10*3/uL (ref 1.0–3.6)
Monocytes Absolute: 1.2 10*3/uL — ABNORMAL HIGH (ref 0.2–1.0)
Monocytes Relative: 9 %
Neutro Abs: 8.6 10*3/uL — ABNORMAL HIGH (ref 1.4–6.5)
Neutrophils Relative %: 67 %

## 2016-03-09 LAB — CBC
HCT: 48.3 % (ref 40.0–52.0)
Hemoglobin: 16.1 g/dL (ref 13.0–18.0)
MCH: 29.4 pg (ref 26.0–34.0)
MCHC: 33.3 g/dL (ref 32.0–36.0)
MCV: 88.2 fL (ref 80.0–100.0)
PLATELETS: 308 10*3/uL (ref 150–440)
RBC: 5.48 MIL/uL (ref 4.40–5.90)
RDW: 14.5 % (ref 11.5–14.5)
WBC: 12.9 10*3/uL — ABNORMAL HIGH (ref 3.8–10.6)

## 2016-03-09 LAB — PROTIME-INR
INR: 0.96
PROTHROMBIN TIME: 12.8 s (ref 11.4–15.2)

## 2016-03-09 LAB — TROPONIN I: Troponin I: <0.03 ng/mL (ref <0.03)

## 2016-03-09 LAB — APTT: aPTT: 29 seconds (ref 24–36)

## 2016-03-09 MED ORDER — LORAZEPAM 1 MG PO TABS
1.0000 mg | ORAL_TABLET | Freq: Once | ORAL | Status: AC
  Administered 2016-03-09: 1 mg via ORAL
  Filled 2016-03-09: qty 1

## 2016-03-09 NOTE — ED Notes (Addendum)
Pt states that he has to be "drugged" to have his labs drawn. Pt uncooperative in triage. Pt states he is very afraid of needles.

## 2016-03-09 NOTE — ED Notes (Signed)
Pt is now agreeing to have labs obtained.

## 2016-03-09 NOTE — ED Triage Notes (Signed)
Pt ambulatory to triage desk with no difficulty or distress.  

## 2016-03-09 NOTE — ED Triage Notes (Signed)
Pt with dizziness for the past couple of weeks. Pt with hx of stroke back in 2013.

## 2016-03-09 NOTE — ED Notes (Signed)
Wife repeatedly asking for something to eat for pt. Informed multiple times that the doctor has to see pt before he can eat.

## 2016-03-09 NOTE — ED Provider Notes (Signed)
[facility] Emergency Department Provider Note    First MD Initiated Contact with Patient 03/09/16 1940     (approximate)  I have reviewed the triage vital signs and the nursing notes.   HISTORY  Chief Complaint Dizziness    HPI Larry Padilla is a 51 y.o. male patient with a history of posterior occipital hemorrhagic stroke several years ago presents with his primary concern being confusion and memory loss over the past 6 months. States that today he became more acutely stressed out due to recent illnesses of his mother-in-law as well as daughter. Start complaining of headache earlier this morning checked his blood pressure which was elevated. States that he took his lisinopril as directed and came to the ER. While waiting in triage she states that his headache has resolved. He states that he was having episode of dizziness earlier but that is resolved. States that he frequently gets these symptoms when he is very stressed out and his blood pressure is high. He has a follow-up appointment with his family practitioner this Friday. Denies any numbness or tingling that is new. States he does have a persistent blurry spot in the vision of his right eye that will occasionally become worse when he is stressed out and his blood pressure gets high that this is been ongoing for several months. Denies any trauma. Denies any recent fevers. No cough or shortness of breath. No nausea or vomiting.   Past Medical History:  Diagnosis Date  . CVA (cerebral vascular accident) [facility]) feb 2013  . Hyperlipidemia   . Hypertension    No family history on file. Past Surgical History:  Procedure Laterality Date  . BACK SURGERY  2005   Patient Active Problem List   Diagnosis Date Noted  . Hyperlipidemia LDL goal < 100 05/17/2011  . Obesity, Class II, BMI 35.0-39.9, with comorbidity (see actual BMI) 05/17/2011  . Vision loss 05/17/2011  . Anxiety 05/17/2011  . Headache(784.0)  05/12/2011  . Hypertension, malignant 05/12/2011      Prior to Admission medications   Medication Sig Start Date End Date Taking? Authorizing Provider  mirtazapine (REMERON) 30 MG tablet Take 15 mg by mouth daily. 01/23/16  Yes Historical Provider, MD  ondansetron (ZOFRAN) 4 MG tablet Take 4 mg by mouth every 8 (eight) hours as needed for nausea. 09/30/15 09/29/16 Yes Historical Provider, MD  HYDROcodone-acetaminophen (NORCO/VICODIN) 5-325 MG per tablet Take 1 tablet by mouth every 6 (six) hours as needed for pain. 05/21/12   Derwood Kaplan, MD  lisinopril-hydrochlorothiazide (PRINZIDE,ZESTORETIC) 20-25 MG per tablet Take 1 tablet by mouth daily. 07/19/12   Amber Nydia Bouton, MD  pantoprazole (PROTONIX) 40 MG tablet Take 40 mg by mouth daily. 12/19/15   Historical Provider, MD    Allergies Patient has no known allergies.    Social History Social History  Substance Use Topics  . Smoking status: Former Games developer  . Smokeless tobacco: Never Used     Comment: quit 18 years ago (documented in 2013)  . Alcohol use Yes    Review of Systems Patient denies headaches, rhinorrhea, blurry vision, numbness, shortness of breath, chest pain, edema, cough, abdominal pain, nausea, vomiting, diarrhea, dysuria, fevers, rashes or hallucinations unless otherwise stated above in HPI. ____________________________________________   PHYSICAL EXAM:  VITAL SIGNS: Vitals:   03/09/16 2100 03/09/16 2115  BP: (!) 192/120 (!) 179/120  Pulse: 83 84  Resp: 16 (!) 21  Temp:      Constitutional: Alert and oriented. Well appearing and  in no acute distress. Eyes: Conjunctivae are normal. PERRL. EOMI. Head: Atraumatic. Nose: No congestion/rhinnorhea. Mouth/Throat: Mucous membranes are moist.  Oropharynx non-erythematous. Neck: No stridor. Painless ROM. No cervical spine tenderness to palpation Hematological/Lymphatic/Immunilogical: No cervical lymphadenopathy. Cardiovascular: Normal rate, regular rhythm. Grossly  normal heart sounds.  Good peripheral circulation. Respiratory: Normal respiratory effort.  No retractions. Lungs CTAB. Gastrointestinal: Soft and nontender. No distention. No abdominal bruits. No CVA tenderness. Genitourinary:  Musculoskeletal: No lower extremity tenderness nor edema.  No joint effusions. Neurologic: CN- intact.  No facial droop, Normal FNF.  Normal heel to shin.  Sensation intact bilaterally. Normal speech and language. No gross focal neurologic deficits are appreciated. No gait instability.  Skin:  Skin is warm, dry and intact. No rash noted. Psychiatric: Mood and affect are normal. Speech and behavior are normal.  ____________________________________________   LABS (all labs ordered are listed, but only abnormal results are displayed)  Results for orders placed or performed during the hospital encounter of 03/09/16 (from the past 24 hour(s))  Protime-INR     Status: None   Collection Time: 03/09/16  6:33 PM  Result Value Ref Range   Prothrombin Time 12.8 11.4 - 15.2 seconds   INR 0.96   APTT     Status: None   Collection Time: 03/09/16  6:33 PM  Result Value Ref Range   aPTT 29 24 - 36 seconds  CBC     Status: Abnormal   Collection Time: 03/09/16  6:33 PM  Result Value Ref Range   WBC 12.9 (H) 3.8 - 10.6 K/uL   RBC 5.48 4.40 - 5.90 MIL/uL   Hemoglobin 16.1 13.0 - 18.0 g/dL   HCT 93.248.3 35.540.0 - 73.252.0 %   MCV 88.2 80.0 - 100.0 fL   MCH 29.4 26.0 - 34.0 pg   MCHC 33.3 32.0 - 36.0 g/dL   RDW 20.214.5 54.211.5 - 70.614.5 %   Platelets 308 150 - 440 K/uL  Differential     Status: Abnormal   Collection Time: 03/09/16  6:33 PM  Result Value Ref Range   Neutrophils Relative % 67 %   Neutro Abs 8.6 (H) 1.4 - 6.5 K/uL   Lymphocytes Relative 22 %   Lymphs Abs 2.9 1.0 - 3.6 K/uL   Monocytes Relative 9 %   Monocytes Absolute 1.2 (H) 0.2 - 1.0 K/uL   Eosinophils Relative 1 %   Eosinophils Absolute 0.2 0 - 0.7 K/uL   Basophils Relative 1 %   Basophils Absolute 0.1 0 - 0.1 K/uL    Comprehensive metabolic panel     Status: Abnormal   Collection Time: 03/09/16  6:33 PM  Result Value Ref Range   Sodium 138 135 - 145 mmol/L   Potassium 4.9 3.5 - 5.1 mmol/L   Chloride 105 101 - 111 mmol/L   CO2 28 22 - 32 mmol/L   Glucose, Bld 136 (H) 65 - 99 mg/dL   BUN 16 6 - 20 mg/dL   Creatinine, Ser 2.371.07 0.61 - 1.24 mg/dL   Calcium 9.8 8.9 - 62.810.3 mg/dL   Total Protein 8.2 (H) 6.5 - 8.1 g/dL   Albumin 4.5 3.5 - 5.0 g/dL   AST 46 (H) 15 - 41 U/L   ALT 51 17 - 63 U/L   Alkaline Phosphatase 69 38 - 126 U/L   Total Bilirubin 0.2 (L) 0.3 - 1.2 mg/dL   GFR calc non Af Amer >60 >60 mL/min   GFR calc Af Amer >60 >60 mL/min   Anion  gap 5 5 - 15  Troponin I     Status: None   Collection Time: 03/09/16  6:33 PM  Result Value Ref Range   Troponin I <0.03 <0.03 ng/mL   ____________________________________________  EKG My review and personal interpretation at Time: 16:52   Indication: htn  Rate: 70  Rhythm: sinus Axis: left Other: non specific st changes, no STEMI,  ____________________________________________  RADIOLOGY  I personally reviewed all radiographic images ordered to evaluate for the above acute complaints and reviewed radiology reports and findings.  These findings were personally discussed with the patient.  Please see medical record for radiology report. ____________________________________________   PROCEDURES  Procedure(s) performed:  Procedures    Critical Care performed: no ____________________________________________   INITIAL IMPRESSION / ASSESSMENT AND PLAN / ED COURSE  Pertinent labs & imaging results that were available during my care of the patient were reviewed by me and considered in my medical decision making (see chart for details).  DDX: htn urgency, htn emergency, dehydration, anxiety, cva, tia, seizure, orthostasis, dementia  Bridger Pizzi is a 51 y.o. who presents to the ED with complaint of concern about his elevated Bp, however  his primary concern appears to be increasing confusion and memory loss over the past 5-6 months.  Patient is AFVSS in ED. Exam as above. Given current presentation have considered the above differential.  Neuro exam is intact.  Ct imaging of head appears stable when compared to previous.  Not consistent with SAH.  No new deficits.  Symptoms have improved throughout day.  No evidence of dysrhythmia or acute heart strain.  No hypoxia to suggest APE.  Patient admits to marijuana use for the past several months but reluctant to attribute confusion/symptoms to that.  BP improved with time and relaxation in the Ed.  Does have multiple stressors at home likely contributing.  Blood work reassuring.  Do not feel that further emergent diagnostic testing clinically indicated at this time as patient well appearing and in NAD.  He is requesting discharge home and demonstrates a good understanding of signs and symptoms for which he should return to the Ed.  Patient was able to tolerate PO and was able to ambulate with a steady gait.  Have discussed with the patient and available family all diagnostics and treatments performed thus far and all questions were answered to the best of my ability. The patient demonstrates understanding and agreement with plan.   Clinical Course      ____________________________________________   FINAL CLINICAL IMPRESSION(S) / ED DIAGNOSES  Final diagnoses:  Secondary hypertension  Dizziness  Memory loss      NEW MEDICATIONS STARTED DURING THIS VISIT:  Discharge Medication List as of 03/09/2016  8:55 PM       Note:  This document was prepared using Dragon voice recognition software and may include unintentional dictation errors.     Willy Eddy, MD 03/09/16 2222

## 2016-03-09 NOTE — ED Notes (Addendum)
Pt states x 3 weeks has had the following symptoms: memory loss, dizziness, tingling in legs. Pt states "I don't even remember coming to the hospital today. Pt appears in no distress, ambulated to room. Pt states his BP has been high, does not know what it was at home, just "high". Pt states he takes lisinopril but wife states he has an appt with doc on Friday to talk about a second BP med. Pt talking in complete sentences.

## 2016-03-09 NOTE — ED Notes (Signed)
Pt provided meal tray, ok by Dr. Roxan Hockeyobinson

## 2016-03-09 NOTE — ED Notes (Signed)
Labs not received.

## 2016-03-09 NOTE — ED Notes (Signed)
Pt stating that he wants to go. Dr. Roxan Hockeyobinson informed.

## 2017-05-12 IMAGING — CT CT HEAD W/O CM
3 series · 16 of 47 positions shown, 19 images · non-contrast
Comparison: CT head 11/18/2011

CLINICAL DATA: Dizziness over the past several weeks. History of
infarct in 6838 in the left PCA distribution.

EXAM:
CT HEAD WITHOUT CONTRAST
TECHNIQUE: Contiguous axial images were obtained from the base of the skull
through the vertex without intravenous contrast.

[Series 2: head wo · axial · 0.46mm/px · z∈[-98,+47]mm · 10 of 35 slices shown, 13 images]
[im 3/35  brain]
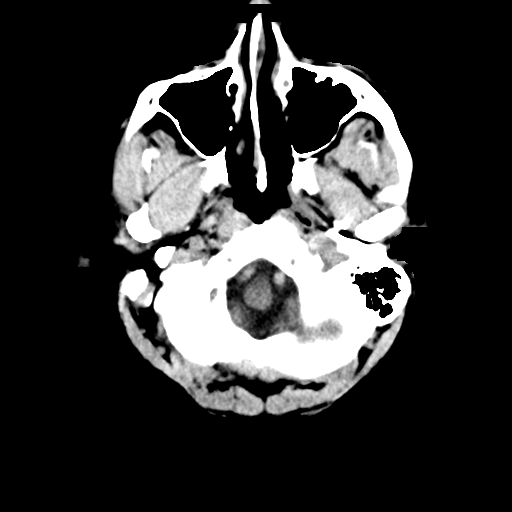
[im 3/35  bone]
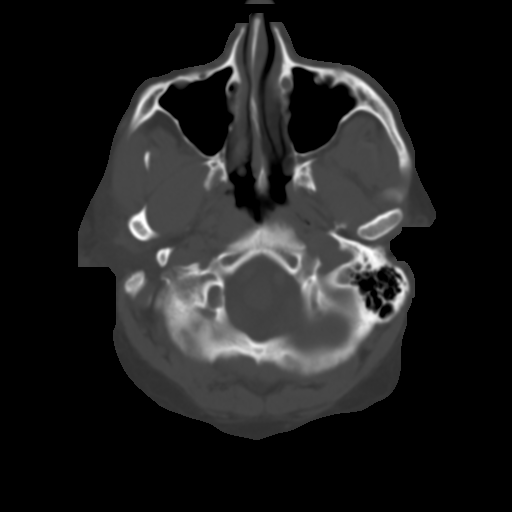
[im 6/35  brain]
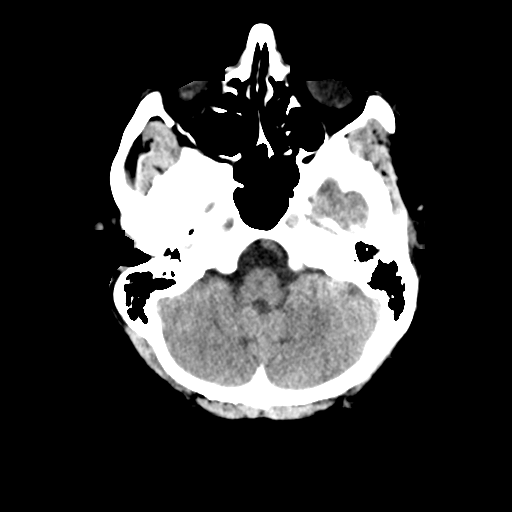
[im 10/35  brain]
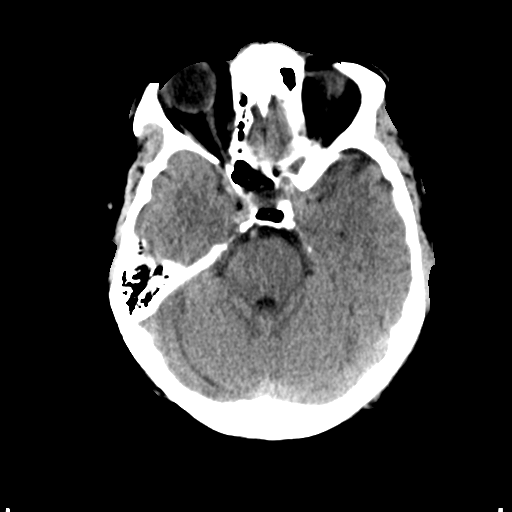
[im 12/35  brain]
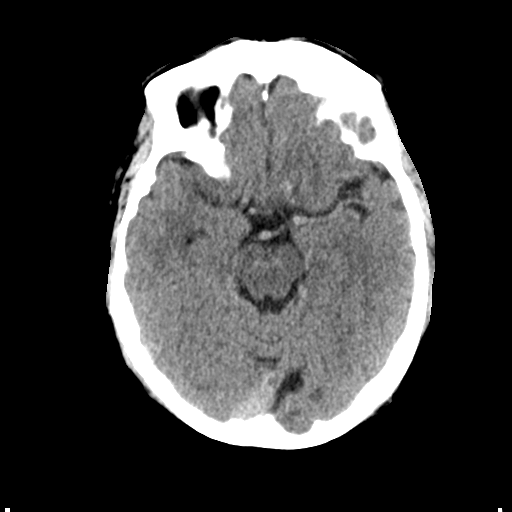
[im 16/35  brain]
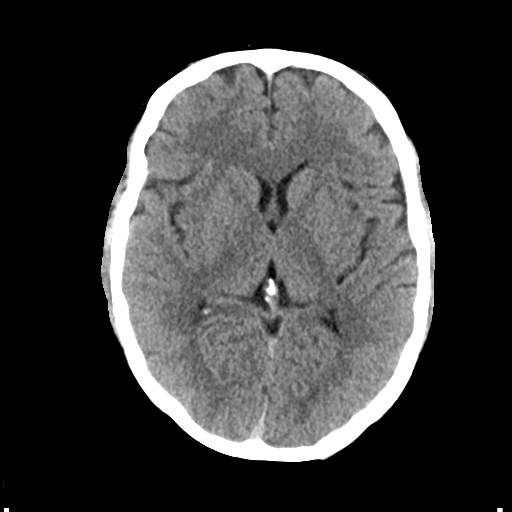
[im 16/35  bone]
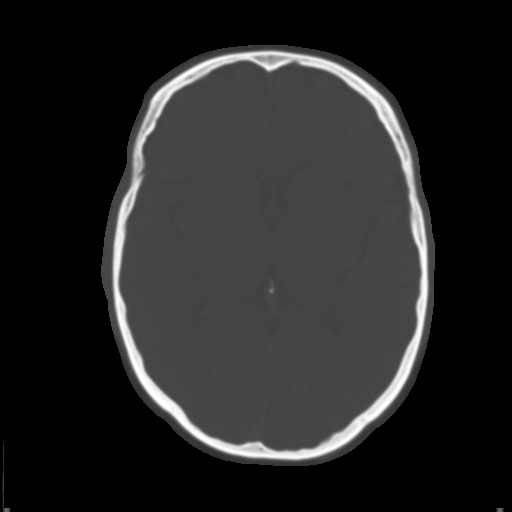
[im 19/35  brain]
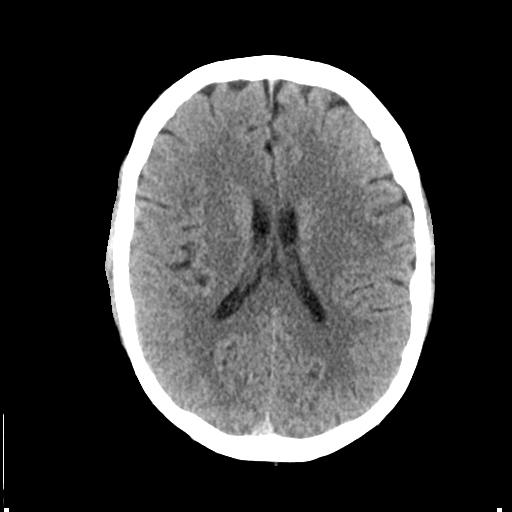
[im 23/35  brain]
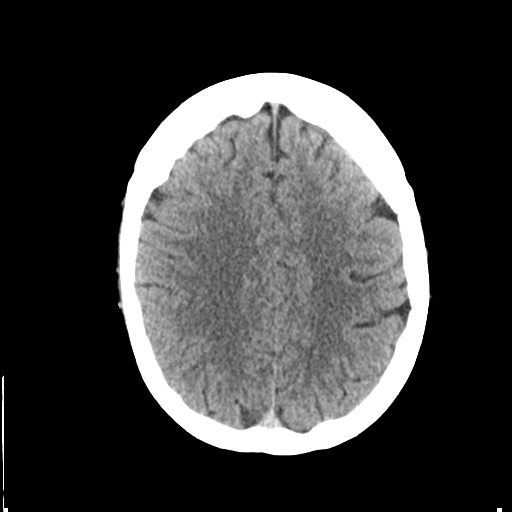
[im 26/35  brain]
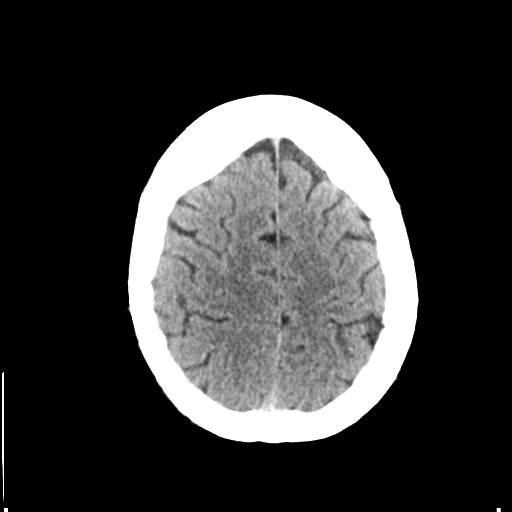
[im 29/35  brain]
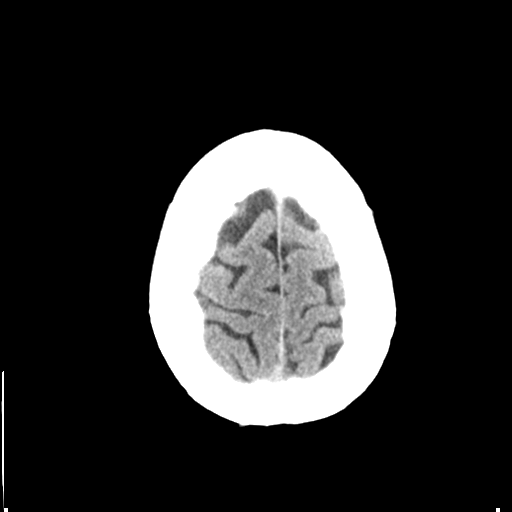
[im 29/35  bone]
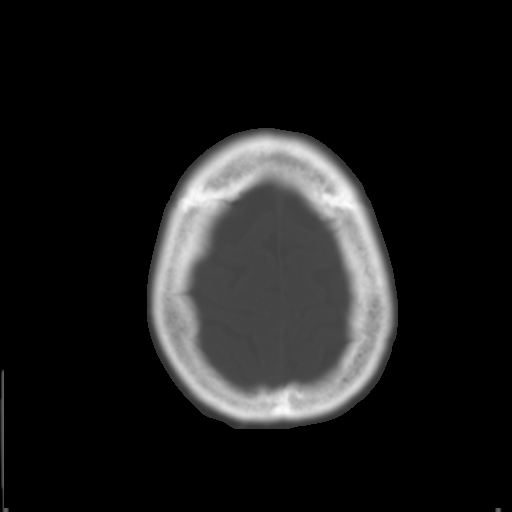
[im 32/35  brain]
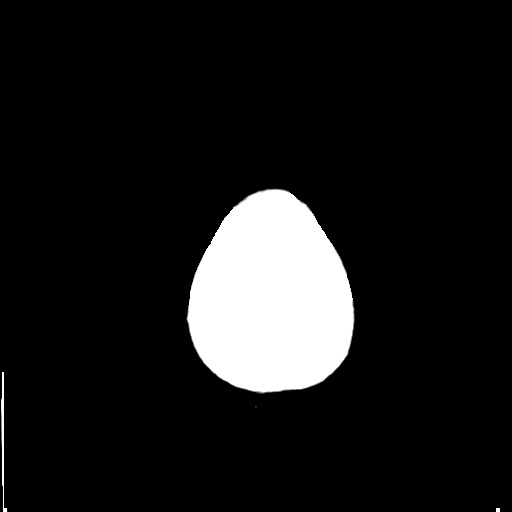

[Series 4: coronal soft tissue · coronal · 0.34mm/px · 3 of 67 slices shown]
[im 23/67  brain]
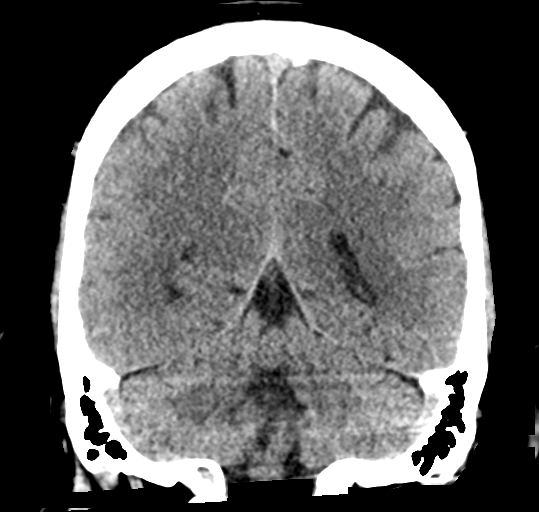
[im 30/67  brain]
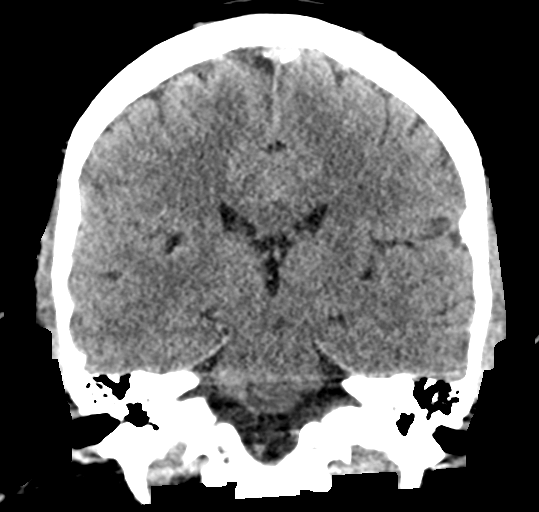
[im 37/67  brain]
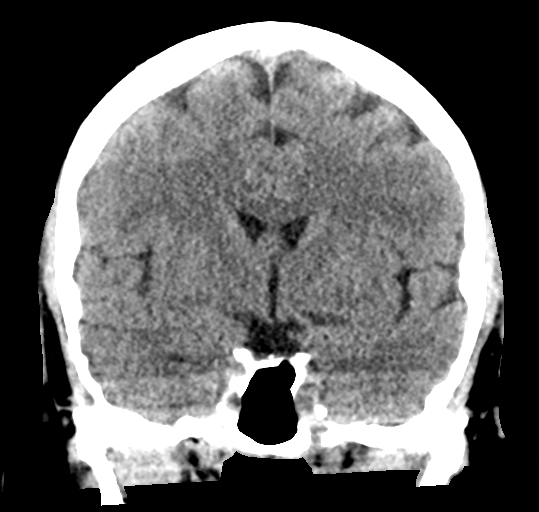

[Series 5: sagittal soft tissue · sagittal · 0.34mm/px · 3 of 56 slices shown]
[im 19/56  brain]
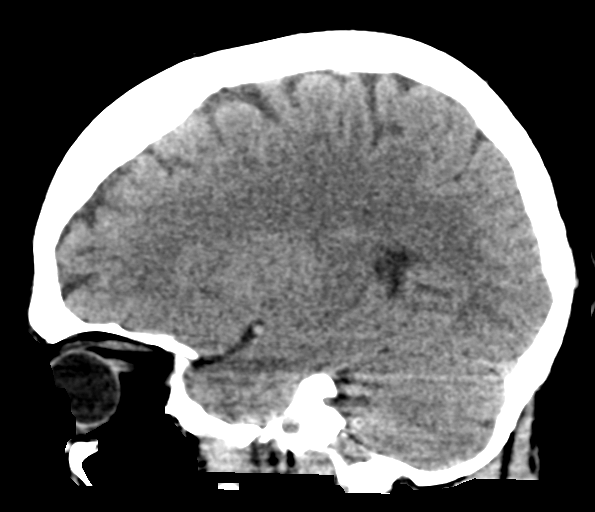
[im 28/56  brain]
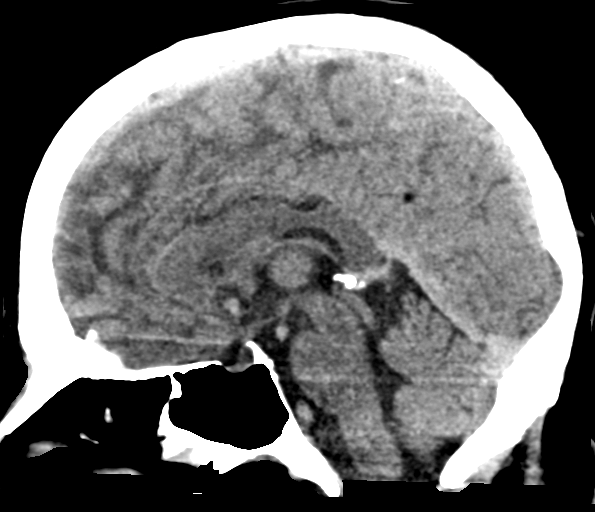
[im 37/56  brain]
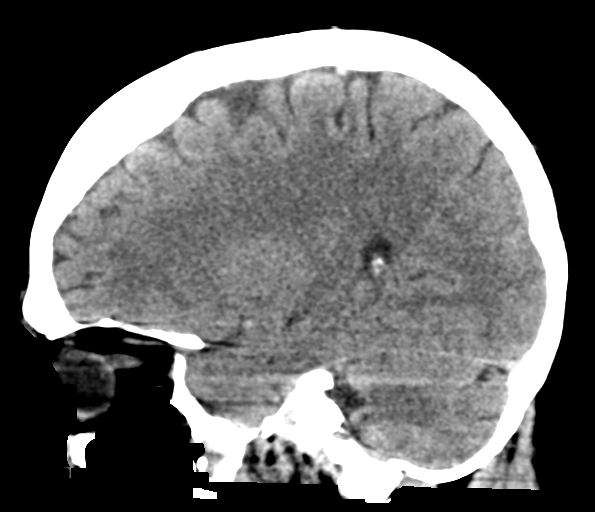

[16 of 47 positions shown; findings below may reference images not displayed]

FINDINGS: Brain: Left occipital lobe encephalomalacia from chronic left PCA
infarct. No acute infarction noted. Chronic minimal small vessel
ischemic changes of periventricular white matter are identified. No
acute large vascular territory infarction, hemorrhage or midline
shift. No effacement of the basal cisterns. No ventriculomegaly. No
intra-axial mass lesion or extra-axial fluid.

Vascular: No hyperdense vessels or unexpected calcifications.

Skull: No acute osseous abnormality.

Sinuses/Orbits: Nonacute

Other: Negative
IMPRESSION: 1. Chronic left PCA distribution infarct with encephalomalacia
involving the left occipital lobe.
2. No acute intracranial abnormality.
3. Chronic minimal small vessel ischemic disease of periventricular
white matter.

## 2024-03-29 ENCOUNTER — Ambulatory Visit
Admission: RE | Admit: 2024-03-29 | Discharge: 2024-03-29 | Disposition: A | Source: Ambulatory Visit | Attending: Orthopedic Surgery | Admitting: Orthopedic Surgery

## 2024-03-29 ENCOUNTER — Other Ambulatory Visit: Payer: Self-pay | Admitting: Orthopedic Surgery

## 2024-03-29 DIAGNOSIS — M25511 Pain in right shoulder: Secondary | ICD-10-CM

## 2024-03-29 DIAGNOSIS — M7531 Calcific tendinitis of right shoulder: Secondary | ICD-10-CM
